# Patient Record
Sex: Female | Born: 1955 | Race: White | Hispanic: No | Marital: Married | Smoking: Former smoker
Health system: Southern US, Academic
[De-identification: ages and names within clinical notes are randomized; demographics above are authoritative.]

## PROBLEM LIST (undated history)

## (undated) DIAGNOSIS — M545 Low back pain, unspecified: Secondary | ICD-10-CM

## (undated) DIAGNOSIS — I10 Essential (primary) hypertension: Secondary | ICD-10-CM

## (undated) DIAGNOSIS — E559 Vitamin D deficiency, unspecified: Secondary | ICD-10-CM

## (undated) DIAGNOSIS — J449 Chronic obstructive pulmonary disease, unspecified: Secondary | ICD-10-CM

## (undated) HISTORY — DX: Chronic obstructive pulmonary disease, unspecified: J44.9

## (undated) HISTORY — PX: COLONOSCOPY: SHX174

## (undated) HISTORY — PX: HX LIPOMA RESECTION: SHX23

## (undated) HISTORY — DX: Low back pain, unspecified: M54.50

## (undated) HISTORY — DX: Vitamin D deficiency, unspecified: E55.9

## (undated) HISTORY — DX: Essential (primary) hypertension: I10

---

## 1993-05-27 ENCOUNTER — Other Ambulatory Visit (HOSPITAL_COMMUNITY): Payer: Self-pay | Admitting: Family Medicine

## 2021-05-30 ENCOUNTER — Other Ambulatory Visit: Payer: Self-pay

## 2021-05-30 ENCOUNTER — Ambulatory Visit (INDEPENDENT_AMBULATORY_CARE_PROVIDER_SITE_OTHER): Payer: BC Managed Care – PPO | Admitting: OTOLARYNGOLOGY

## 2021-05-30 ENCOUNTER — Encounter (INDEPENDENT_AMBULATORY_CARE_PROVIDER_SITE_OTHER): Payer: Self-pay | Admitting: OTOLARYNGOLOGY

## 2021-05-30 VITALS — Ht 68.0 in | Wt 170.0 lb

## 2021-05-30 DIAGNOSIS — H9201 Otalgia, right ear: Secondary | ICD-10-CM

## 2021-05-30 DIAGNOSIS — H6123 Impacted cerumen, bilateral: Secondary | ICD-10-CM

## 2021-05-30 DIAGNOSIS — J329 Chronic sinusitis, unspecified: Secondary | ICD-10-CM

## 2021-05-30 DIAGNOSIS — J309 Allergic rhinitis, unspecified: Secondary | ICD-10-CM

## 2021-05-30 MED ORDER — TRIAMCINOLONE ACETONIDE 55 MCG NASAL SPRAY AEROSOL
2.0000 | INHALATION_SPRAY | Freq: Every day | NASAL | 3 refills | Status: DC
Start: 2021-05-30 — End: 2022-12-28

## 2021-05-30 NOTE — Procedures (Signed)
[facility], [facility]  [address]  [place] [address]    Procedure Note    Name: Mckenzie Stewart MRN:  [id number]   Date: 05/30/2021 Age: 66 y.o.       [phone] - REMOVAL IMPACTED CERUMEN W/ INSTRUMENT, UNILATERAL (AMB ONLY-PD)  Performed by: Conchita Paris, DO  Authorized by: Conchita Paris, DO     Time Out:     Immediately before the procedure, a time out was called:  Yes    Patient verified:  Yes    Procedure Verified:  Yes    Site Verified:  Yes  Documentation:      Indications for procedure: Obstructive nasal breathing    Anesthesia: Oxymetazoline nasal spray    Description: Nasal endoscopy with rigid scope was performed with examination of the  septum, inferior, middle, and superior meatus, turbinates, sphenoethmoidal recess, and nasopharynx.     There were no polyps, pus, or granulation tissue noted.  ET orifices and nasopharynx were normal.     Findings: Septal deviation, AR, no mucopus or polyps    The patient tolerated the procedure well.          41660 - NASAL ENDOSCOPY DIAGNOSTIC UNILATERAL OR BILATERAL (AMB ONLY)  Performed by: Conchita Paris, DO  Authorized by: Conchita Paris, DO     Time Out:     Immediately before the procedure, a time out was called:  Yes    Patient verified:  Yes    Procedure Verified:  Yes    Site Verified:  Yes  Documentation:      Procedure: Cerumen cleaning  Pre-op Dx: Cerumen impaction      Bilateral EAC(s) examined under binocular microscopy.  Cerumen and/or debris was cleaned from the canal(s) using curettes, suction, and alligator forceps.          Conchita Paris, DO

## 2021-05-30 NOTE — H&P (Signed)
[facility], [facility]  [address]  [place] [address]  Phone: [phone]  Fax: [phone]      Encounter Date: 05/30/2021    Patient ID: Mckenzie Stewart  MRN: [id number]    DOB: 1955-12-15  Age: 66 y.o. female        Referring Provider:    Lynann Beaver, MD  [address]  [place],  [place] [address]    Reason for Visit:   Chief Complaint   Patient presents with   . Sinus Problem     Complains of allergies and nasal congestion. States took Singulair in the past and had vertigo after that.        History of Present Illness:  Mckenzie Stewart is a 66 y.o. female who is referred for eval of sinuses. Pt c/o frequent sinus infections, Q4-5 months last year. She usually has sinus pain in her cheeks and increased nasal drainage with infections. She has been taking sudafed for years but stopped recently. She uses saline only. Flonase caused nosebleeds.    She also c/o right earache intermittently, along with an occasional whoosh sound. Recalls an episode of vertigo lasting 2 weeks after taking Singulair in May 2022, and no recurrence.     Patient History:  There is no problem list on file for this patient.    Current Outpatient Medications   Medication Sig   . ascorbic acid (VITAMIN C) 1,000 mg Oral Tablet Take 1 Tablet (1,000 mg total) by mouth Once a day   . BREZTRI AEROSPHERE 160-9-4.8 mcg/actuation Inhalation HFA Aerosol Inhaler    . calcium citrate-vitamin D3 (CITRACAL) 200 mg-6.25 mcg (250 unit) Oral Tablet Take by mouth Once a day   . chlorpheniramine maleate (CHLORPHEN SR ORAL) Take by mouth   . cholecalciferol, vitamin D3, 250 mcg (10,000 unit) Oral Capsule Take 1 Capsule (10,000 Units total) by mouth Once a day   . guaiFENesin 100 mg/5 mL Oral Liquid Take by mouth Every 4 hours as needed   . hydrOXYzine pamoate (VISTARIL) 100 mg Oral Capsule Take 1 Capsule (100 mg total) by mouth Three times a day as needed for Itching   . pediatric multivitamins Oral Tablet, Chewable Chew 1 Tablet Once a  day   . Triamcinolone Acetonide (NASACORT) 55 mcg Nasal Aerosol, Spray Administer 2 Sprays into affected nostril(s) Once a day   . turmeric/turmeric ext/pepr ext (TURMERIC-TURMERIC EXT-PEPPER) 500-3 mg Oral Capsule Take by mouth     No Known Allergies  Past Medical History:   Diagnosis Date   . COPD (chronic obstructive pulmonary disease) (CMS HCC)       History reviewed. No pertinent surgical history.   Family Medical History:    None         Social History     Tobacco Use   . Smoking status: Never   . Smokeless tobacco: Never       Review of Systems:  Review of Systems   Constitutional: Negative.    HENT: Positive for ear pain, sinus pressure, sinus pain, tinnitus and voice change.        Physical Exam:  Ht 1.727 m (5\' 8" )   Wt 77.1 kg (170 lb)   BMI 25.85 kg/m       Physical Exam  Constitutional:       Appearance: Normal appearance. She is well-developed, well-groomed and normal weight.   HENT:      Head: Normocephalic and atraumatic.      Right Ear: Hearing, tympanic  membrane, ear canal and external ear normal. There is impacted cerumen.      Left Ear: Hearing, tympanic membrane, ear canal and external ear normal. There is impacted cerumen.      Nose: Septal deviation and mucosal edema present.      Right Turbinates: Enlarged.      Left Turbinates: Enlarged.      Mouth/Throat:      Lips: Pink.      Mouth: Mucous membranes are moist.      Pharynx: Oropharynx is clear. Uvula midline.   Eyes:      Extraocular Movements: Extraocular movements intact.   Neck:      Trachea: Phonation normal.   Pulmonary:      Effort: Pulmonary effort is normal.   Musculoskeletal:      Cervical back: Normal range of motion and neck supple.   Lymphadenopathy:      Cervical: No cervical adenopathy.   Skin:     General: Skin is warm.   Neurological:      Mental Status: She is alert and oriented to person, place, and time.      Cranial Nerves: Cranial nerves 2-12 are intact. No facial asymmetry.   Psychiatric:         Attention and  Perception: Attention normal.         Mood and Affect: Mood normal.         Speech: Speech normal.         Behavior: Behavior normal. Behavior is cooperative.         Assessment:  ENCOUNTER DIAGNOSES     ICD-10-CM   1. Otalgia of right ear  H92.01   2. Chronic sinusitis  J32.9   3. Allergic rhinitis  J30.9   4. Bilateral impacted cerumen  H61.23       Plan:  Medical records reviewed on 05/30/2021.  Will order audiogram. Will get Sinus CT. Bilat cerumen removed. Rx Nasacort  Orders Placed This Encounter   . 06301 - NASAL ENDOSCOPY DIAGNOSTIC UNILATERAL OR BILATERAL (AMB ONLY)   . 60109 - REMOVAL IMPACTED CERUMEN W/ INSTRUMENT, UNILATERAL (AMB ONLY-PD)   . CT SINUSES WO IV CONTRAST   . AMB PRN REFERRAL EXTERNAL AUDIOLOGIST   . Triamcinolone Acetonide (NASACORT) 55 mcg Nasal Aerosol, Spray     Return for after imaging.Marcelline Deist, PA-C  05/30/2021, 10:26  The advanced practice clinician's documentation was reviewed/amended in its entirety with the assessment and plan portion completely performed independently by me during this separate encounter.

## 2021-07-05 ENCOUNTER — Inpatient Hospital Stay
Admission: RE | Admit: 2021-07-05 | Discharge: 2021-07-05 | Disposition: A | Discharge status: Home / Self Care | Payer: BC Managed Care – PPO | Source: Ambulatory Visit | Attending: OTOLARYNGOLOGY | Admitting: OTOLARYNGOLOGY

## 2021-07-05 ENCOUNTER — Other Ambulatory Visit: Payer: Self-pay

## 2021-07-05 DIAGNOSIS — J329 Chronic sinusitis, unspecified: Secondary | ICD-10-CM | POA: Insufficient documentation

## 2021-07-12 ENCOUNTER — Other Ambulatory Visit (HOSPITAL_COMMUNITY): Payer: Self-pay | Admitting: NURSE PRACTITIONER

## 2021-07-12 DIAGNOSIS — Z1239 Encounter for other screening for malignant neoplasm of breast: Secondary | ICD-10-CM

## 2021-07-14 ENCOUNTER — Encounter (INDEPENDENT_AMBULATORY_CARE_PROVIDER_SITE_OTHER): Payer: Self-pay | Admitting: OTOLARYNGOLOGY

## 2021-07-14 ENCOUNTER — Other Ambulatory Visit: Payer: Self-pay

## 2021-07-14 ENCOUNTER — Ambulatory Visit (INDEPENDENT_AMBULATORY_CARE_PROVIDER_SITE_OTHER): Payer: BC Managed Care – PPO | Admitting: OTOLARYNGOLOGY

## 2021-07-14 VITALS — Ht 68.0 in | Wt 170.0 lb

## 2021-07-14 DIAGNOSIS — J309 Allergic rhinitis, unspecified: Secondary | ICD-10-CM

## 2021-07-14 DIAGNOSIS — J329 Chronic sinusitis, unspecified: Secondary | ICD-10-CM

## 2021-07-14 MED ORDER — SULFAMETHOXAZOLE 800 MG-TRIMETHOPRIM 160 MG TABLET
1.0000 | ORAL_TABLET | Freq: Two times a day (BID) | ORAL | 0 refills | Status: DC
Start: 2021-07-14 — End: 2022-12-28

## 2021-07-14 NOTE — H&P (Signed)
[facility]  [facility]    Progress Note    Name: Mckenzie Stewart MRN:  [id number]   Date: 07/14/2021 Age: 66 y.o.          Follow Up      Subjective:   Chief Complaint:   Follow-up After Testing (Rc after ct)       History of Present Illness:  Mckenzie Stewart is Stewart 66 y.o. old female who presents to the clinic for follow-up after CT. Results are below.  Patient states nasacort made her anxious.  Patient states that she is still having nasal congestion and drainage. Last abx was in Dec.       PACS Images    Show images for CT SINUSES WO IV CONTRAST    [facility], [facility]   [address]  [place] [address]   Phone: [phone] Fax: [phone]    PATIENT NAME: Mckenzie Stewart, Mckenzie Stewart: [id number]   BIRTH DATE: 12/26/1955 ORDER: [id number]   SEX: F ORDER FROM: [facility]   REQUESTING PHYS: Conchita ParisWeitzel, Meerab Maselli SERVICE DATE: 07/05/2021 10:42 AM    ATTENDING Vaughan BrownerPHYS: Conchita ParisWeitzel, Serine Kea REPORT DATE: 07/05/2021 10:56 AM   REASON: Chronic sinusitis     Final  CT SINUSES WO IV CONTRAST              Study Result    Narrative & Impression   Mckenzie Stewart    PROCEDURE DESCRIPTION: CT OF THE SINUSES WITHOUT INTRAVENOUS CONTRAST    CLINICAL HISTORY: Chronic sinusitis       COMPARISON: None        TECHNIQUE: Stewart CT of the paranasal sinuses is obtained utilizing contiguous axial imaging. Stewart intravenous contrast material was injected.      FINDINGS: Mild-to-moderate mucoperiosteal thickening is identified in the right maxillary sinus. Stewart air-fluid levels are noted.  Partial opacification of the right ostiomeatal unit is noted.Minimal deviation of the nasal septum is noted. Concha bullosa is noted on the left. Stewart focal soft tissue swelling is seen.  The mastoid air cells have Stewart normal appearance. The intracranial contents cannot be adequately evaluated secondary to technical factors.      IMPRESSION:  Mild to moderate mucoperiosteal thickening is seen in the right maxillary  sinus.      Radiologist location ID: [id number]         Linked Documents    View Report                   This study was interpreted by: Neomia DearGroten, David L, MD   This report has been reviewed and released by: Signed by: Neomia Dearavid L Groten, MD on 07/05/2021 10:56 AM           Linked Documents    View Report     PACS Images    Show images for CT SINUSES WO IV CONTRAST    Breast CA Risk Scores    Stewart risk assessment data    Patient Release Status:     This result is not viewable because Stewart one can access it in [facility].           In Basket Reviewed    Conchita ParisWeitzel, Judson Tsan, DO on 07/05/2021 11:07     Discharge Instructions  Mckenzie Stewart, Mckenzie Stewart (MRN [id number])  None     Additional Order Information    Open Additional Information     IR Procedure Timeline (If Applicable)    IR Procedure Timeline  Link to Procedure Log    Procedure Log        Decision Support      Appropriateness Score Ordering Provider     Indeterminate        Session ID Source CDSM Identifier Adherence   [id number] Web Service [facility] [id number]) Stewart Criteria Available (MG)     Date Time Consulted Exception Comment   05/30/21 10:48:18       Hard Copy Result Report    Open Hard Copy Result Report (Order #[id number] - CT SINUSES WO IV CONTRAST)     Exam Details    Technologist Performed   Cordie Grice, RT (R)(CT) 07/05/2021 10:42 AM       Radiation Exposure    Type Dose   CT 66 DLP (mGycm)     End Exam Questions    History:  Dx: Chronic sinusitis   Was patient education performed?  Yes   Patient was shielded? N/Stewart   Did you complete the lift assist section for the patient? NA   Personal Protective Equipment was worn? Stewart   PPE Used:  -   Was patient held during exposure? Stewart   Patient was held by: -     CT SINUSES WO IV CONTRAST  Order: [id number]   Status: Final result       Visible to patient: Stewart (inaccessible in [facility])       Dx: Chronic sinusitis       0 Result Notes      Details    Reading Physician Reading Date Result Priority    Neomia Dear, MD  [phone] 07/05/2021 Routine     Narrative & Impression  Mckenzie Stewart    PROCEDURE DESCRIPTION: CT OF THE SINUSES WITHOUT INTRAVENOUS CONTRAST    CLINICAL HISTORY: Chronic sinusitis       COMPARISON: None        TECHNIQUE: Stewart CT of the paranasal sinuses is obtained utilizing contiguous axial imaging. Stewart intravenous contrast material was injected.      FINDINGS: Mild-to-moderate mucoperiosteal thickening is identified in the right maxillary sinus. Stewart air-fluid levels are noted.  Partial opacification of the right ostiomeatal unit is noted.Minimal deviation of the nasal septum is noted. Concha bullosa is noted on the left. Stewart focal soft tissue swelling is seen.  The mastoid air cells have Stewart normal appearance. The intracranial contents cannot be adequately evaluated secondary to technical factors.      IMPRESSION:  Mild to moderate mucoperiosteal thickening is seen in the right maxillary sinus.               Review of Systems     Physical Exam:     Vitals:    07/14/21 1058   Weight: 77.1 kg (170 lb)   Height: 1.727 m (5\' 8" )   BMI: 25.9      ENT Physical Exam  Constitutional  Appearance: patient appears well-developed, well-nourished and well-groomed,  Communication/Voice: communication appropriate for developmental age; vocal quality normal;  Head and Face  Appearance: head appears normal, face appears normal and face appears atraumatic;  Palpation: facial palpation normal;  Salivary: glands normal;  Ear  Hearing: intact;  Auricles: right auricle normal; left auricle normal;  External Mastoids: right external mastoid normal; left external mastoid normal;  Ear Canals: right ear canal normal; left ear canal normal;  Tympanic Membranes: right tympanic membrane normal; left tympanic membrane normal;  Nose  External Nose: nares patent bilaterally; external nose normal;  Internal  Nose: bilateral intranasal mucosa edematous; nasal septal deviation present; bilateral inferior turbinates  erythematous; with hypertrophy;  Oral Cavity/Oropharynx  Lips: normal;  Teeth: normal;  Gums: gingiva normal;  Tongue: normal;  Oral mucosa: normal;  Hard palate: normal;  Neck  Neck: neck normal; neck palpation normal;  Thyroid: thyroid normal;  Respiratory  Inspection: breathing unlabored; normal breathing rate;  Lymphatic  Palpation: lymph nodes normal;  Neurovestibular  Mental Status: alert and oriented;  Psychiatric: mood normal; affect is appropriate;  Cranial Nerves: cranial nerves intact;       Assessment and Plan:       ICD-10-CM    1. Chronic sinusitis, unspecified location  J32.9       2. Allergic rhinitis, unspecified seasonality, unspecified trigger  J30.9         Orders Placed This Encounter   . trimethoprim-sulfamethoxazole (BACTRIM DS) 160-800mg  per tablet     CT reviewed and will give Stewart course of bactrim  Will check SET    Follow up:  Return for Follow up after testing.Conchita Paris, DO

## 2021-07-19 ENCOUNTER — Telehealth (INDEPENDENT_AMBULATORY_CARE_PROVIDER_SITE_OTHER): Payer: Self-pay | Admitting: OTOLARYNGOLOGY

## 2021-07-19 MED ORDER — AMOXICILLIN 500 MG CAPSULE
500.0000 mg | ORAL_CAPSULE | Freq: Two times a day (BID) | ORAL | 0 refills | Status: DC
Start: 2021-07-19 — End: 2021-08-11

## 2021-07-19 NOTE — Telephone Encounter (Signed)
See below. Please advise.  

## 2021-07-19 NOTE — Telephone Encounter (Signed)
Dr. Hulan Saas gave patient Bactrim says she cannot take this is asking for Amoxicillin without augmentin. [place]

## 2021-07-25 ENCOUNTER — Other Ambulatory Visit: Payer: Self-pay

## 2021-07-25 ENCOUNTER — Encounter (INDEPENDENT_AMBULATORY_CARE_PROVIDER_SITE_OTHER): Payer: Self-pay | Admitting: OTOLARYNGOLOGY

## 2021-07-25 ENCOUNTER — Ambulatory Visit (INDEPENDENT_AMBULATORY_CARE_PROVIDER_SITE_OTHER): Payer: BC Managed Care – PPO | Admitting: OTOLARYNGOLOGY

## 2021-07-25 VITALS — Ht 68.0 in | Wt 170.0 lb

## 2021-07-25 DIAGNOSIS — H9201 Otalgia, right ear: Secondary | ICD-10-CM

## 2021-07-25 DIAGNOSIS — J329 Chronic sinusitis, unspecified: Secondary | ICD-10-CM

## 2021-07-25 DIAGNOSIS — H903 Sensorineural hearing loss, bilateral: Secondary | ICD-10-CM

## 2021-07-25 DIAGNOSIS — J309 Allergic rhinitis, unspecified: Secondary | ICD-10-CM

## 2021-07-25 MED ORDER — AZELASTINE 137 MCG (0.1 %) NASAL SPRAY
1.0000 | Freq: Two times a day (BID) | NASAL | 3 refills | Status: DC
Start: 2021-07-25 — End: 2022-12-28

## 2021-07-25 NOTE — H&P (Signed)
[facility], [facility]  [address]  [place] [address]  Phone: [phone]  Fax: [phone]      Encounter Date: 07/25/2021    Patient ID: MATTIA OSTERMAN  MRN: [id number]    DOB: Jul 31, 1955  Age: 66 y.o. female     Progress Note       Referring Provider:  No ref. provider found    Reason for Visit:   Chief Complaint   Patient presents with   . Follow-up After Testing     Rc after audio        History of Present Illness:  Mckenzie Stewart is a 66 y.o. female who is FU on CRS. She took 4.5 days of Bactrim but didn't tolerate it well (felt arm weakness). She started Amox last week for R>L facial pressure. Asso with PND. Sx's may be slightly better. Denies fever. SET scheduled for June 5.     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    Current Outpatient Medications   Medication Sig   . amoxicillin (AMOXIL) 500 mg Oral Capsule Take 1 Capsule (500 mg total) by mouth Twice daily   . ascorbic acid (VITAMIN C) 1,000 mg Oral Tablet Take 1 Tablet (1,000 mg total) by mouth Once a day   . azelastine (ASTELIN) 137 mcg (0.1 %) Nasal Aerosol, Spray Administer 1 Spray into each nostril Twice daily Use in each nostril as directed   . BREZTRI AEROSPHERE 160-9-4.8 mcg/actuation Inhalation HFA Aerosol Inhaler    . calcium citrate-vitamin D3 (CITRACAL) 200 mg-6.25 mcg (250 unit) Oral Tablet Take by mouth Once a day   . chlorpheniramine maleate (CHLORPHEN SR ORAL) Take by mouth   . cholecalciferol, vitamin D3, 250 mcg (10,000 unit) Oral Capsule Take 1 Capsule (10,000 Units total) by mouth Once a day   . guaiFENesin 100 mg/5 mL Oral Liquid Take by mouth Every 4 hours as needed   . hydrOXYzine pamoate (VISTARIL) 100 mg Oral Capsule Take 1 Capsule (100 mg total) by mouth Three times a day as needed for Itching   . pediatric multivitamins Oral Tablet, Chewable Chew 1 Tablet Once a day   . Triamcinolone Acetonide (NASACORT)  55 mcg Nasal Aerosol, Spray Administer 2 Sprays into affected nostril(s) Once a day   . trimethoprim-sulfamethoxazole (BACTRIM DS) 160-800mg  per tablet Take 1 Tablet (160 mg total) by mouth Twice daily   . turmeric/turmeric ext/pepr ext (TURMERIC-TURMERIC EXT-PEPPER) 500-3 mg Oral Capsule Take by mouth      No Known Allergies  Past Medical History:   Diagnosis Date   . COPD (chronic obstructive pulmonary disease) (CMS HCC)      History reviewed. No pertinent surgical history.  Family Medical History:    None         Social History     Tobacco Use   . Smoking status: Never   . Smokeless tobacco: Never       Review of Systems:  Review of Systems    Physical Exam:  Ht 1.727 m (5\' 8" )   Wt 77.1 kg (170 lb)   BMI 25.85 kg/m       Physical Exam  Constitutional:       Appearance: Normal appearance. She is well-developed, well-groomed and normal weight.   HENT:      Head: Normocephalic and atraumatic.  Right Ear: Hearing, tympanic membrane, ear canal and external ear normal.      Left Ear: Hearing, tympanic membrane, ear canal and external ear normal.      Nose: Septal deviation and mucosal edema present.      Right Turbinates: Enlarged.      Left Turbinates: Enlarged.      Mouth/Throat:      Lips: Pink.      Mouth: Mucous membranes are moist.      Pharynx: Oropharynx is clear. Uvula midline.   Eyes:      Extraocular Movements: Extraocular movements intact.   Neck:      Trachea: Phonation normal.   Pulmonary:      Effort: Pulmonary effort is normal.   Musculoskeletal:      Cervical back: Normal range of motion and neck supple.   Lymphadenopathy:      Cervical: No cervical adenopathy.   Skin:     General: Skin is warm.   Neurological:      Mental Status: She is alert and oriented to person, place, and time.      Cranial Nerves: Cranial nerves 2-12 are intact. No facial asymmetry.   Psychiatric:         Attention and Perception: Attention normal.         Mood and Affect: Mood normal.         Speech: Speech normal.          Behavior: Behavior normal. Behavior is cooperative.         Assessment:  ENCOUNTER DIAGNOSES     ICD-10-CM   1. Chronic sinusitis, unspecified location  J32.9   2. Allergic rhinitis, unspecified seasonality, unspecified trigger  J30.9   3. Otalgia of right ear  H92.01   4. 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  Reviewed audiogram. Doreatha Martin course of Amox. Rx Azelastine.   Orders Placed This Encounter   . AMB PRN REFERRAL EXTERNAL AUDIOLOGIST   . azelastine (ASTELIN) 137 mcg (0.1 %) Nasal Aerosol, Spray     Return for follow up after ABR.    Marcelline Deist, PA-C  07/25/2021, 15:53   The advanced practice clinician's documentation was reviewed/amended in its entirety with the assessment and plan portion completely performed independently by me during this separate encounter.

## 2021-07-29 ENCOUNTER — Telehealth (INDEPENDENT_AMBULATORY_CARE_PROVIDER_SITE_OTHER): Payer: Self-pay | Admitting: OTOLARYNGOLOGY

## 2021-07-29 NOTE — Telephone Encounter (Signed)
[facility] called they need the referral for this patient to be seen there

## 2021-07-29 NOTE — Telephone Encounter (Signed)
faxed

## 2021-08-11 ENCOUNTER — Other Ambulatory Visit: Payer: Self-pay

## 2021-08-11 ENCOUNTER — Ambulatory Visit (INDEPENDENT_AMBULATORY_CARE_PROVIDER_SITE_OTHER): Payer: BC Managed Care – PPO | Admitting: OTOLARYNGOLOGY

## 2021-08-11 ENCOUNTER — Encounter (INDEPENDENT_AMBULATORY_CARE_PROVIDER_SITE_OTHER): Payer: Self-pay | Admitting: OTOLARYNGOLOGY

## 2021-08-11 VITALS — Ht 68.0 in | Wt 170.0 lb

## 2021-08-11 DIAGNOSIS — H903 Sensorineural hearing loss, bilateral: Secondary | ICD-10-CM

## 2021-08-11 DIAGNOSIS — J309 Allergic rhinitis, unspecified: Secondary | ICD-10-CM

## 2021-08-11 DIAGNOSIS — J329 Chronic sinusitis, unspecified: Secondary | ICD-10-CM

## 2021-08-11 NOTE — H&P (Signed)
[facility] [place]    Progress Note    Name: ABIGAELLE STRIDE MRN:  [id number]   Date: 08/11/2021 Age: 66 y.o.          Follow Up      Subjective:   Chief Complaint:   Follow-up After Testing (Rc after abr)       History of Present Illness:  WENONA MAGNONE is a 65 y.o. old female who presents to the clinic for follow-up after ABR which is normal.  Patient denies any new complaints.  Patient states that asetlin made her very jitter and she could not take.      Review of Systems     Physical Exam:     Vitals:    08/11/21 1116   Weight: 77.1 kg (170 lb)   Height: 1.727 m (5\' 8" )   BMI: 25.9      ENT Physical Exam  Constitutional  Appearance: patient appears well-developed, well-nourished and well-groomed,  Communication/Voice: communication appropriate for developmental age; vocal quality normal;  Head and Face  Appearance: head appears normal, face appears normal and face appears atraumatic;  Palpation: facial palpation normal;  Salivary: glands normal;  Ear  Hearing: intact;  Auricles: right auricle normal; left auricle normal;  External Mastoids: right external mastoid normal; left external mastoid normal;  Ear Canals: right ear canal normal; left ear canal normal;  Tympanic Membranes: right tympanic membrane normal; left tympanic membrane normal;  Nose  External Nose: nares patent bilaterally; external nose normal;  Internal Nose: bilateral intranasal mucosa edematous; nasal septal deviation present; bilateral inferior turbinates erythematous; with hypertrophy;  Oral Cavity/Oropharynx  Lips: normal;  Teeth: normal;  Gums: gingiva normal;  Tongue: normal;  Oral mucosa: normal;  Hard palate: normal;  Neck  Neck: neck normal; neck palpation normal;  Thyroid: thyroid normal;  Respiratory  Inspection: breathing unlabored; normal breathing rate;  Lymphatic  Palpation: lymph nodes normal;  Neurovestibular  Mental Status: alert and oriented;  Psychiatric: mood normal; affect is appropriate;  Cranial Nerves:  cranial nerves intact;       Assessment and Plan:       ICD-10-CM    1. Chronic sinusitis, unspecified location  J32.9       2. Allergic rhinitis, unspecified seasonality, unspecified trigger  J30.9       3. 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    Dia Sitter, DO

## 2021-08-15 ENCOUNTER — Ambulatory Visit (INDEPENDENT_AMBULATORY_CARE_PROVIDER_SITE_OTHER): Payer: BC Managed Care – PPO

## 2021-08-15 ENCOUNTER — Ambulatory Visit (INDEPENDENT_AMBULATORY_CARE_PROVIDER_SITE_OTHER): Payer: BC Managed Care – PPO | Admitting: OTOLARYNGOLOGY

## 2021-08-15 ENCOUNTER — Other Ambulatory Visit: Payer: Self-pay

## 2021-08-15 ENCOUNTER — Encounter (INDEPENDENT_AMBULATORY_CARE_PROVIDER_SITE_OTHER): Payer: Self-pay | Admitting: OTOLARYNGOLOGY

## 2021-08-15 VITALS — Ht 68.0 in | Wt 170.0 lb

## 2021-08-15 DIAGNOSIS — H903 Sensorineural hearing loss, bilateral: Secondary | ICD-10-CM

## 2021-08-15 DIAGNOSIS — J3089 Other allergic rhinitis: Secondary | ICD-10-CM

## 2021-08-15 DIAGNOSIS — Z91038 Other insect allergy status: Secondary | ICD-10-CM

## 2021-08-15 DIAGNOSIS — J3081 Allergic rhinitis due to animal (cat) (dog) hair and dander: Secondary | ICD-10-CM

## 2021-08-15 DIAGNOSIS — J329 Chronic sinusitis, unspecified: Secondary | ICD-10-CM

## 2021-08-15 DIAGNOSIS — J301 Allergic rhinitis due to pollen: Secondary | ICD-10-CM

## 2021-08-15 NOTE — Nursing Note (Signed)
08/15/21 1500   Allergy Testing   Is the patient on a beta blocker? No   Does the patient have asthma? No   Site    Testing site 1 Right Side   French Daegen Berrocal Territories Grass   French Julicia Krieger Territories Grass Dilution 4 5   French Teaghan Formica Territories Grass Dilution 3 6   French Bailyn Spackman Territories Grass Dilution 2 8   TOTAL French Leeann Bady Territories GRASS DILUTION @ 3   French Oliviagrace Crisanti Territories Grass Total Single/Multiple Stick: 3   Timothy Grass   Timothy Grass Dilution 4 6   Timothy Grass Dilution 2 6   Timothy Grass Total Single/Multiple Stick: 2   [place]    Bahia Dilution 4 5   Bahia Dilution 3 5   Bahia Dilution 2 8   TOTAL BAHIA DILUTION @ 3   Bahia Total Single/Multiple Stick: 3   Ragweed   Ragweed Dilution 4 5   Ragweed Dilution 3 7   Ragweed Dilution 2 8   TOTAL RAGWEED DILUTION @ 3   Ragweed Total Single/Multiple Stick: 3   Mugwort   Mugwort Dilution 5 5   Mugwort Dilution 4 8   TOTAL MUGWORT DILUTION @ 5   Mugwort Total Single/Multiple Stick: 2   Guernsey Thistle   Guernsey Thistle Dilution 5 5   Russian Thistle Dilution 4 7   TOTAL RUSSIAN THISTLE DILUTION @ 4   Russian Thistle Total Single/Multiple Stick: 2   Maple (Box Elder)   Maple Dilution 4 5   Maple Dilution 3 5   Maple Dilution 2 8   TOTAL MAPLE DILUTION @ 3   Maple Total Single/Multiple Stick: 3   Red Oak   [employer]  Dilution 4 5   Red Oak  Dilution 3 5   Red Oak  Dilution 2 7   TOTAL RED OAK DILUTION @ 2   Red Oak Total Single/Multiple Stick: 3   American Sycamore   American Sycamore Dilution 4 5   American Sycamore Dilution 3 5   American Sycamore Dilution 2 8   TOTAL AMERICAN SYCAMORE DILUTION   @ 3   American Sycamore Total Single/Multiple Stick: 3   Pine   Pine Dilution 4 5   Pine Dilution 2 6   Pine Total Single/Multiple Stick: 2   SITE   Testing site 2 Right Side   Cladosporium Sphaerospermum   Cladosporium Sphaerospermum Dilution 4 5   Cladosporium Sphaerospermum Dilution 2 6   Cladosporium Sphaerospermum Total Single/Multiple Stick: 2   Aspergillus   Aspergillus Dilution 4 5   Aspergillus Dilution 3 5   Aspergillus Dilution 2 8   TOTAL  ASPERGILLUS  DILUTION @ 3   Aspergillus Total Single/Multiple Stick: 3   Alternaria   Alternaria Dilution 4 5   Alternaria Dilution 3 5   Alternaria Dilution 2 8   TOTAL ALTERNARIA DILUTION @ 3   Alternaria Total Single/Multiple Stick: 3   Candida   Candida Dilution 4 5   Candida Dilution 2 6   Candida Total Single/Multiple Stick: 2   Epidermophyton   Epidermophyton Dilution 4 5   Epidermophyton Dilution 3 7   Epidermophyton Dilution 2 7   TOTAL EPIDERMOPHYTON DILUTION @ 3   Epidermophyton Total Single/Multiple Stick: 3   Sarocladium Strictum   Sarocladium Strictum Dilution 4 5   Sarocladium Strictum Dilution 3 5   Sarocladium Strictum Dilution 2 7   TOTAL SAROCLADIUM STRICTUM DILUTION @ 2   Sarocladium Strictum Total Single/Multiple Stick: 3   Bipolaris Sorokinina   Bipolaris Sorokiniana Dilution 4 6  Bipolaris Sorokiniana Dilution 2 5   Bipolaris Sorokiniana Total Single/Multiple Stick: 2   Penicillium   Penicillium Dilution 4 5   Penicillium Dilution 2 5   Penicillium Total Single/Multiple Stick: 2   Corn Smut   Corn Smut Dilution 4 5   Corn Smut Dilution 2 5   Corn Smut Total Single/Multiple Stick: 2   Aureobasidium Pullulans   Aureobasidium Pullulans Dilution 4 5   Aureobasidium Pullulans Dilution 3 5   Aureobasidium Pullulans Dilution 2 7   TOTAL AUREOBASIDIUM PULLULANS DILUTION @ 2   Aureobasidium Pullulans Total Single/Multiple Stick: 3   Gibberella Pulicaris   Gibberella Pulicaris Dilution 4 5   Gibberella Pulicaris Dilution 2 5   Gibberella Pulicaris Total Single/Multiple Stick: 2   Epicoccum   Epicoccum Dilution 4 5   Epicoccum Dilution 3 5   Epicoccum Dilution 2 8   TOTAL EPICOCCUM DILUTION @ 3   Epicoccum Total Single/Multiple Stick: 3   Mucor   Mucor Dilution 4 5   Mucor Dilution 2 6   Mucor Total Single/Multiple Stick: 2   Cockroach   Cockroach Dilution 4 5   Cockroach Dilution 3 5   Cockroach Dilution 2 8   TOTAL COCKROACH DILUTION @ 3   Cockroach Total Single/Multiple Stick: 3   Cat   Cat Dilution  4 5   Cat Dilution 3 5   Cat Dilution 2 8   TOTAL CAT DILUTION @ 3   Cat Total Single/Multiple Stick: 3   Dog   Dog Dilution 4 5   Dog Dilution 2 5   Dog Total Single/Multiple Stick: 2   Rabbit   Rabbit Total Single/Multiple Stick: 3   Dustmite Farinae   Dustmite Farinae Dilution 4 6   Dustmite Farinae Dilution 3 7   TOTAL DUSTMITE FARINAE DILUTION @ 3   Dustmite Farinae Total Single/Multiple Stick: 2   Dustmite Pteronyssinus   Dustmite Pteronyssinus Dilution 4 6   Dustmite Pteronyssinus Dilution 3 7   TOTAL DUSTMITE PTERONYSSINUS DILUTION @ 3   Dustmite Pteronyssinus Total Single/Multiple Stick: 2   Histamine    Histamine 7   Glycerin   Glycerin Class 2 5   Initials   Initials rs   Jasmine Awe, LPN

## 2021-08-15 NOTE — Progress Notes (Signed)
See SET results.

## 2021-08-15 NOTE — H&P (Signed)
ENT, [facility]  [address]  [place] [address]  Phone: [phone]  Fax: [phone]      Encounter Date: 08/15/2021    Patient ID: Mckenzie Stewart  MRN: [id number]    DOB: 06-May-1955  Age: 66 y.o. female     Progress Note       Referring Provider:  No ref. provider found    Reason for Visit:   Chief Complaint   Patient presents with   . Follow-up After Testing     Rc after set testing        History of Present Illness:  Mckenzie Stewart is a 66 y.o. female who is FU on AR. Her allergies are worse since stopping allergy meds for testing.     SET- +reactions to weeds, grass, molds, dust, dander...  Patient History:  There is no problem list on file for this patient.    Current Outpatient Medications   Medication Sig   . ascorbic acid (VITAMIN C) 1,000 mg Oral Tablet Take 1 Tablet (1,000 mg total) by mouth Once a day   . azelastine (ASTELIN) 137 mcg (0.1 %) Nasal Aerosol, Spray Administer 1 Spray into each nostril Twice daily Use in each nostril as directed   . BREZTRI AEROSPHERE 160-9-4.8 mcg/actuation Inhalation HFA Aerosol Inhaler    . calcium citrate-vitamin D3 (CITRACAL) 200 mg-6.25 mcg (250 unit) Oral Tablet Take by mouth Once a day   . chlorpheniramine maleate (CHLORPHEN SR ORAL) Take by mouth   . cholecalciferol, vitamin D3, 250 mcg (10,000 unit) Oral Capsule Take 1 Capsule (10,000 Units total) by mouth Once a day   . guaiFENesin 100 mg/5 mL Oral Liquid Take by mouth Every 4 hours as needed   . hydrOXYzine pamoate (VISTARIL) 100 mg Oral Capsule Take 1 Capsule (100 mg total) by mouth Three times a day as needed for Itching   . pediatric multivitamins Oral Tablet, Chewable Chew 1 Tablet Once a day   . Triamcinolone Acetonide (NASACORT) 55 mcg Nasal Aerosol, Spray Administer 2 Sprays into affected nostril(s) Once a day   . trimethoprim-sulfamethoxazole (BACTRIM DS) 160-800mg  per tablet Take 1 Tablet (160 mg total) by mouth Twice daily   . turmeric/turmeric ext/pepr ext (TURMERIC-TURMERIC  EXT-PEPPER) 500-3 mg Oral Capsule Take by mouth      No Known Allergies  Past Medical History:   Diagnosis Date   . COPD (chronic obstructive pulmonary disease) (CMS HCC)      History reviewed. No pertinent surgical history.  Family Medical History:    None         Social History     Tobacco Use   . Smoking status: Never   . Smokeless tobacco: Never       Review of Systems:  Review of Systems    Physical Exam:  Ht 1.727 m (5\' 8" )   Wt 77.1 kg (170 lb)   BMI 25.85 kg/m       Physical Exam  Constitutional:       Appearance: Normal appearance. She is well-developed, well-groomed and normal weight.   HENT:      Head: Normocephalic and atraumatic.      Right Ear: Hearing, tympanic membrane, ear canal and external ear normal.      Left Ear: Hearing, tympanic membrane, ear canal and external ear normal.      Nose: Septal deviation and mucosal edema present.      Right Turbinates: Enlarged.      Left Turbinates: Enlarged.  Mouth/Throat:      Lips: Pink.      Mouth: Mucous membranes are moist.      Pharynx: Oropharynx is clear. Uvula midline.   Eyes:      Extraocular Movements: Extraocular movements intact.   Neck:      Trachea: Phonation normal.   Pulmonary:      Effort: Pulmonary effort is normal.   Musculoskeletal:      Cervical back: Normal range of motion and neck supple.   Lymphadenopathy:      Cervical: No cervical adenopathy.   Skin:     General: Skin is warm.   Neurological:      Mental Status: She is alert and oriented to person, place, and time.      Cranial Nerves: Cranial nerves 2-12 are intact. No facial asymmetry.   Psychiatric:         Attention and Perception: Attention normal.         Mood and Affect: Mood normal.         Speech: Speech normal.         Behavior: Behavior normal. Behavior is cooperative.         Assessment:  ENCOUNTER DIAGNOSES     ICD-10-CM   1. Seasonal allergic rhinitis due to pollen  J30.1   2. Chronic sinusitis, unspecified location  J32.9   3. 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  Reviewed SET. Pt considering immunotherapy and wishes to start. Start astelin.     Return in about 3 months (around 11/15/2021).    Marcelline Deist, PA-C  08/15/2021, 16:21   The advanced practice clinician's documentation was reviewed/amended in its entirety with the assessment and plan portion completely performed independently by me during this separate encounter.

## 2021-08-17 ENCOUNTER — Other Ambulatory Visit (INDEPENDENT_AMBULATORY_CARE_PROVIDER_SITE_OTHER): Payer: BC Managed Care – PPO | Admitting: OTOLARYNGOLOGY

## 2021-08-17 DIAGNOSIS — J3089 Other allergic rhinitis: Secondary | ICD-10-CM

## 2021-08-17 DIAGNOSIS — J301 Allergic rhinitis due to pollen: Secondary | ICD-10-CM

## 2021-08-17 NOTE — Nursing Note (Signed)
08/17/21 1300   ENT Immunotherapy Vial Preparation Flowsheet   Allergy Test Date 08/15/21   Managing Physician Romney Compean   Initials rs   A   Preparation Date 08/17/21   Expiration Date 11/29/21   Immunotherapy Status   (New Start)   Diagnosis Allergic Rhinitis due to pollen (J30.1)   [place] Grass   Initial Endpoint - [place] Grass 3   Dilution of Antigen 1   Volume of Antigen 0.25ml   [place]   Initial Endpoint - Bahia 3   Dilution of Antigen 1   Volume of Antigen 0.86ml   Ragweed   Initial Endpoint - Ragweed 3   Dilution of Antigen 1   Volume of Antigen 0.22ml   Mugwort   Initial Endpoint - Mugwort 5   Dilution of Antigen 3   Volume of Antigen 0.46ml   Guernsey Thistle   Initial Endpoint - Guernsey Thistle 4   Dilution of Antigen  2   Volume of Antigen 0.59ml   Maple   Initial Endpoint - Maple 3   Dilution of Antigen 1   Volume of Antigen 0.74ml   Oak   Initial Endpoint - Oak 2   Dilution of Antigen Concentrate   Volume of Antigen 0.51ml   Sycamore   Initial Endpoint - Sycamore 3   Dilution of Antigen 1   Volume of Antigen 0.52ml   A   Concentrates A 1.6   10% Glycerine Diluent A 3.9   Total Volume A 5.5   B   Preparation Date 08/17/21   Expiration Date 11/19/21   Immunotherapy Status   (New Start)   Diagnosis Allergic Rhinitis due to cat (J30.81);Allergic Rhinitis due to dustmite (J30.89);Allergic Rhinitis due to mold (J30.89);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 (Z91.038)   Aspergillus   Initial Endpoint - Aspergillus 3   Dilution of Antigen 1   Volume of Antigen 0.55ml   Alternaria   Initial Endpoint - Alternaria 3   Dilution of Antigen 1   Volume of Antigen 0.76ml   Epidermophyton   Initial Endpoint - Epidermophyton 3   Dilution of Antigen 1   Volume of Antigen 0.53ml   Sarocladium Strictum   Initial Endpoint - Sarocladium Strictum 2   Dilution of Antigen Concentrate   Volume of Antigen 0.31ml   Aureobasidium Pullulans   Initial Endpoint - Aureobasidium Pullulans 2   Dilution of Antigen Concentrate   Volume of Antigen 0.9ml    Epicoccum   Initial Endpoint - Epicoccum 3   Dilution of Antigen 1   Volume of Antigen 0.65ml   Cockroach   Initial Endpoint - Cockroach 3   Dilution of Antigen 1   Volume of Antigen 0.60ml   Cat   Initial Endpoint - Cat 3   Dilution of Antigen 1   Volume of Antigen 0.38ml   D. Farinae Dust Mite   Initial Endpoint - D. Farinae Dust Mite 3   Dilution of Antigen Concentrate   Volume of Antigen 0.75ml   D. Pteronyssinus   Initial Endpoint - D. Pteronyssinus 3   Dilution of Antigen Concentrate   Volume of Antigen 0.65ml   B   Concentrates B 2   10% Glycerine Diluent B 3.5   Total Volume B 5.5   Jasmine Awe, LPN

## 2021-08-30 ENCOUNTER — Ambulatory Visit (INDEPENDENT_AMBULATORY_CARE_PROVIDER_SITE_OTHER): Payer: BC Managed Care – PPO

## 2021-08-30 ENCOUNTER — Other Ambulatory Visit: Payer: Self-pay

## 2021-08-30 ENCOUNTER — Other Ambulatory Visit (INDEPENDENT_AMBULATORY_CARE_PROVIDER_SITE_OTHER): Payer: Self-pay | Admitting: NURSE PRACTITIONER

## 2021-08-30 DIAGNOSIS — J309 Allergic rhinitis, unspecified: Secondary | ICD-10-CM

## 2021-08-30 MED ORDER — EPINEPHRINE 0.3 MG/0.3 ML INJECTION, AUTO-INJECTOR
0.3000 mg | AUTO-INJECTOR | Freq: Once | INTRAMUSCULAR | 0 refills | Status: DC | PRN
Start: 2021-08-30 — End: 2023-06-12

## 2021-08-30 NOTE — Nursing Note (Signed)
08/30/21 1400   Vial A   Set 0   Skin test/Wheal Size 0.56ml/ 52mm   Injection Laterality Right   Injection Site Upper Arm   Dose  0.05   Time Observed 30 Minutes   Injection Site Reaction Negative   Vial B   Set 0   Skin test/Wheal Size 0.8ml/ 6mm   Injection Laterality Left   Injection Site Upper Arm   Dose  0.05   Time Observed 30 Minutes   Injection Site Reaction Negative     Merlinda Frederick, LPN  I have reviewed the above allergy vial safety test, give injection.  Elnora Morrison, FNP-BC

## 2021-08-30 NOTE — Telephone Encounter (Signed)
Pt. Needs an RX for an epi pen. Please send RX to [facility].  Merlinda Frederick, LPN

## 2021-09-06 ENCOUNTER — Ambulatory Visit (INDEPENDENT_AMBULATORY_CARE_PROVIDER_SITE_OTHER): Payer: Self-pay

## 2021-09-08 ENCOUNTER — Ambulatory Visit (INDEPENDENT_AMBULATORY_CARE_PROVIDER_SITE_OTHER): Payer: BC Managed Care – PPO

## 2021-09-14 ENCOUNTER — Ambulatory Visit (INDEPENDENT_AMBULATORY_CARE_PROVIDER_SITE_OTHER): Payer: Self-pay

## 2021-09-15 ENCOUNTER — Ambulatory Visit (INDEPENDENT_AMBULATORY_CARE_PROVIDER_SITE_OTHER): Payer: BC Managed Care – PPO

## 2021-09-15 ENCOUNTER — Ambulatory Visit (HOSPITAL_COMMUNITY): Payer: Self-pay

## 2021-09-20 ENCOUNTER — Ambulatory Visit (INDEPENDENT_AMBULATORY_CARE_PROVIDER_SITE_OTHER): Payer: Self-pay

## 2021-09-26 ENCOUNTER — Inpatient Hospital Stay
Admission: RE | Admit: 2021-09-26 | Discharge: 2021-09-26 | Disposition: A | Discharge status: Home / Self Care | Payer: BC Managed Care – PPO | Source: Ambulatory Visit | Attending: NURSE PRACTITIONER | Admitting: NURSE PRACTITIONER

## 2021-09-26 ENCOUNTER — Other Ambulatory Visit: Payer: Self-pay

## 2021-09-26 ENCOUNTER — Encounter (HOSPITAL_COMMUNITY): Payer: Self-pay

## 2021-09-26 DIAGNOSIS — Z1239 Encounter for other screening for malignant neoplasm of breast: Secondary | ICD-10-CM

## 2021-09-26 DIAGNOSIS — Z1231 Encounter for screening mammogram for malignant neoplasm of breast: Secondary | ICD-10-CM | POA: Insufficient documentation

## 2021-09-27 ENCOUNTER — Ambulatory Visit (INDEPENDENT_AMBULATORY_CARE_PROVIDER_SITE_OTHER): Payer: Self-pay

## 2021-10-04 ENCOUNTER — Ambulatory Visit (INDEPENDENT_AMBULATORY_CARE_PROVIDER_SITE_OTHER): Payer: Self-pay

## 2021-10-11 ENCOUNTER — Ambulatory Visit (INDEPENDENT_AMBULATORY_CARE_PROVIDER_SITE_OTHER): Payer: Self-pay

## 2021-10-12 ENCOUNTER — Other Ambulatory Visit: Payer: Self-pay

## 2021-10-12 ENCOUNTER — Other Ambulatory Visit (HOSPITAL_COMMUNITY): Payer: Self-pay | Admitting: NURSE PRACTITIONER

## 2021-10-12 ENCOUNTER — Inpatient Hospital Stay
Admission: RE | Admit: 2021-10-12 | Discharge: 2021-10-12 | Disposition: A | Discharge status: Home / Self Care | Payer: BC Managed Care – PPO | Source: Ambulatory Visit | Attending: NURSE PRACTITIONER | Admitting: NURSE PRACTITIONER

## 2021-10-12 DIAGNOSIS — M2578 Osteophyte, vertebrae: Secondary | ICD-10-CM

## 2021-10-12 DIAGNOSIS — M545 Low back pain, unspecified: Secondary | ICD-10-CM

## 2021-10-18 ENCOUNTER — Other Ambulatory Visit (HOSPITAL_COMMUNITY): Payer: Self-pay | Admitting: NURSE PRACTITIONER

## 2021-10-18 DIAGNOSIS — M5416 Radiculopathy, lumbar region: Secondary | ICD-10-CM

## 2021-10-25 ENCOUNTER — Inpatient Hospital Stay (HOSPITAL_COMMUNITY): Admission: RE | Admit: 2021-10-25 | Payer: BC Managed Care – PPO | Source: Ambulatory Visit

## 2021-11-11 IMAGING — MR MRI LUMBAR SPINE WITHOUT CONTRAST
5 of 6 series · 32 of 48 positions shown · non-contrast
Comparison: None available.

﻿EXAM:  [id number]   MRI LUMBAR SPINE WITHOUT CONTRAST
INDICATION: Low back pain with sciatica in bilateral legs.
TECHNIQUE: Noncontrast multiplanar, multisequence MRI was performed.

[Series 5: T2 · sagittal · 4.0mm · 0.94mm/px · 7 of 13 slices shown (1 of 3)]
[im 1/13]
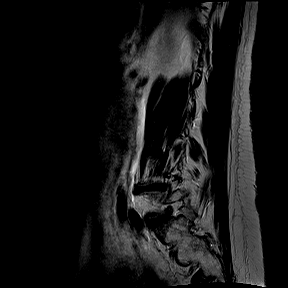
[im 3/13]
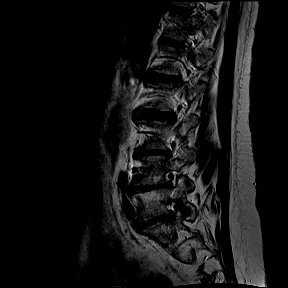
[im 5/13]
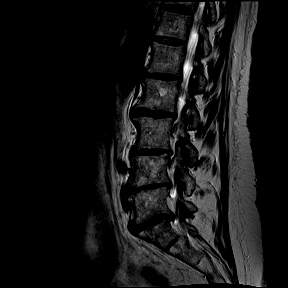
[im 7/13]
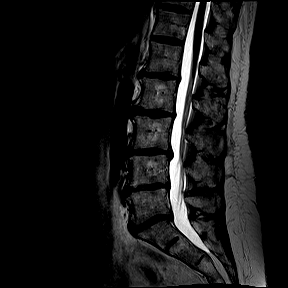
[im 9/13]
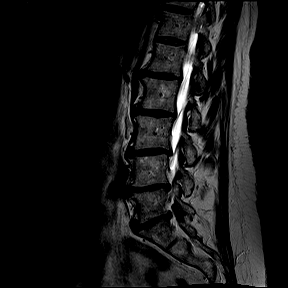
[im 11/13]
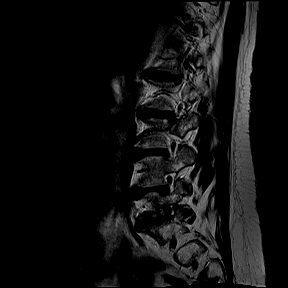
[im 13/13]
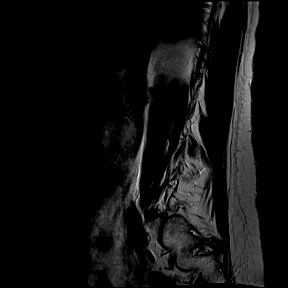

[Series 6: T1 · sagittal · 4.0mm · 0.94mm/px · 6 of 13 slices shown (1 of 2)]
[im 1/13]
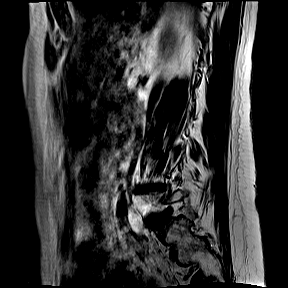
[im 3/13]
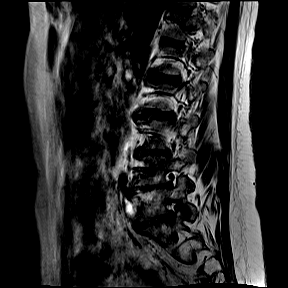
[im 5/13]
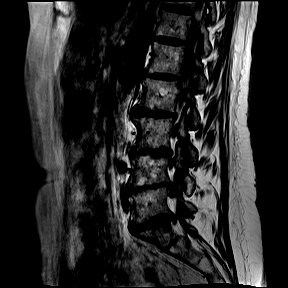
[im 8/13]
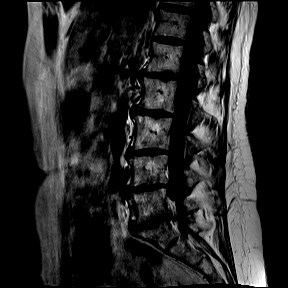
[im 10/13]
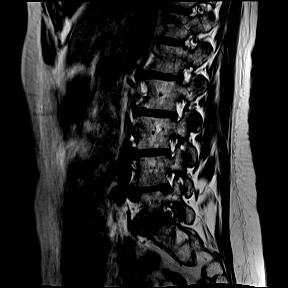
[im 13/13]
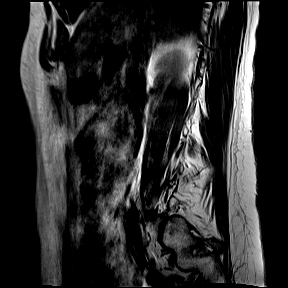

[Series 8: T2 · coronal · 5.0mm · 0.82mm/px · 9 of 18 slices shown (2 of 3)]
[im 1/18]
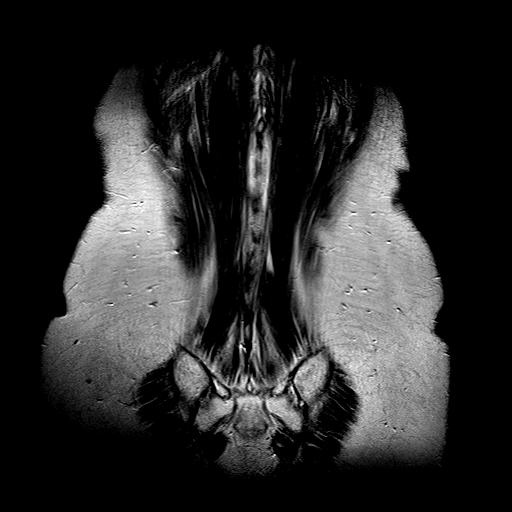
[im 3/18]
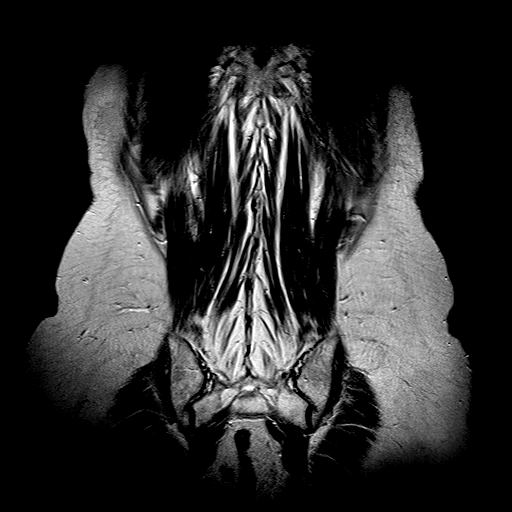
[im 5/18]
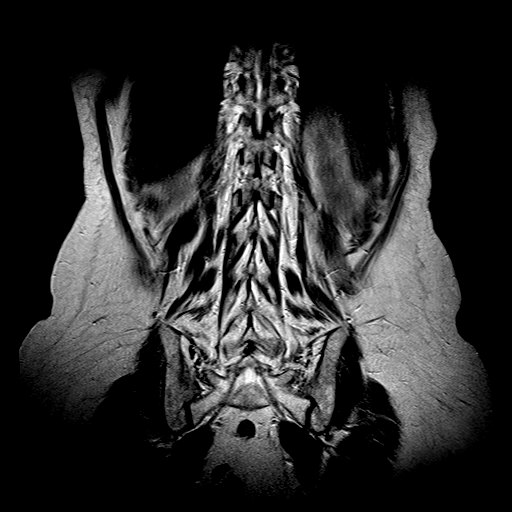
[im 7/18]
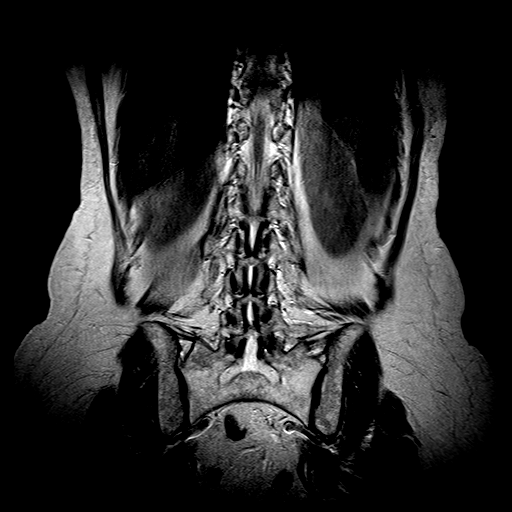
[im 9/18]
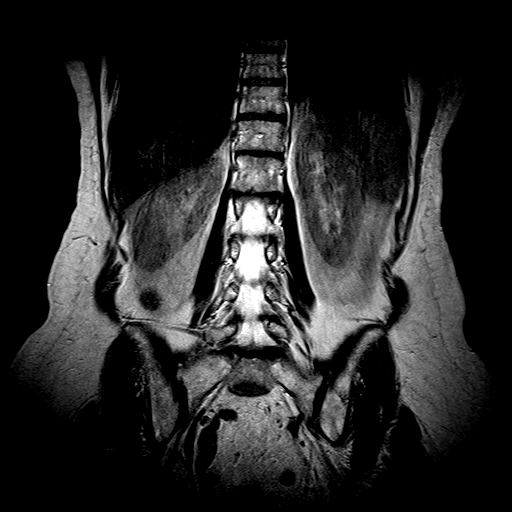
[im 11/18]
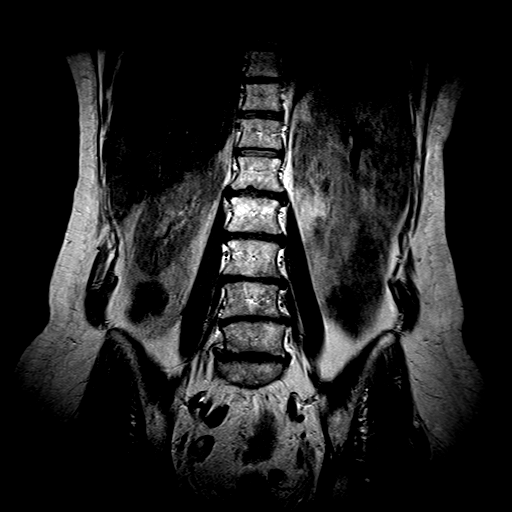
[im 13/18]
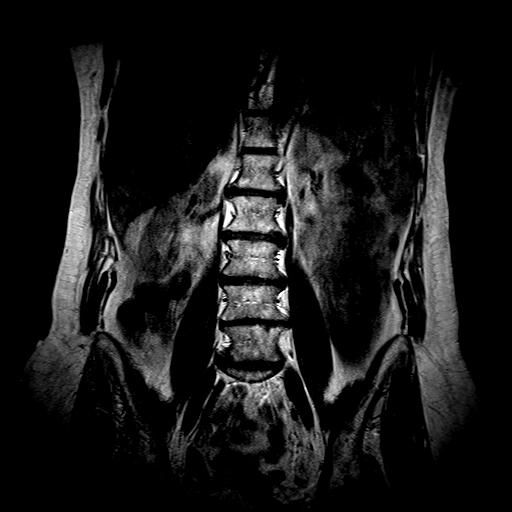
[im 15/18]
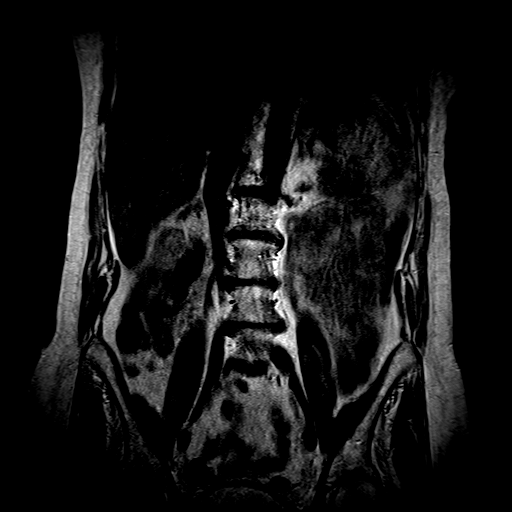
[im 18/18]
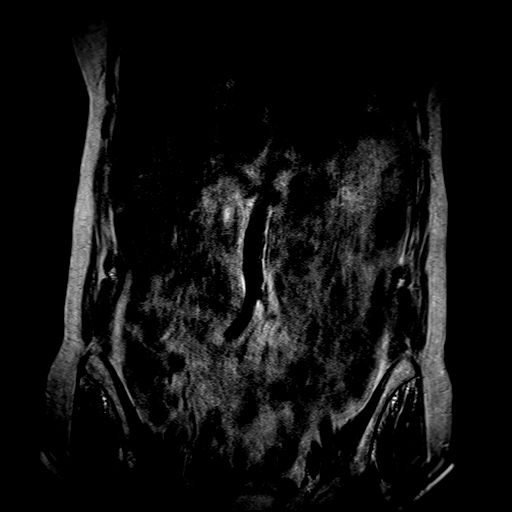

[Series 9: T2 · axial · 4.0mm · 0.52mm/px · z∈[-125,+59]mm · 8 of 22 slices shown (3 of 3)]
[im 1/22]
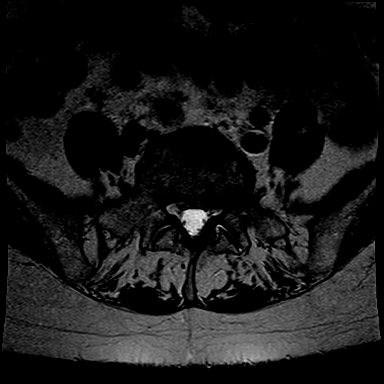
[im 3/22]
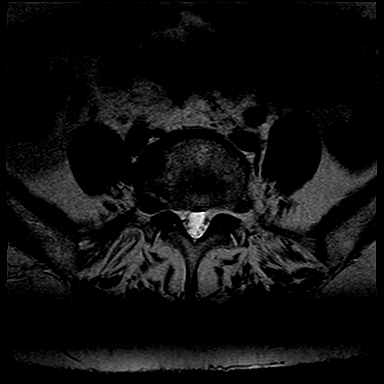
[im 8/22]
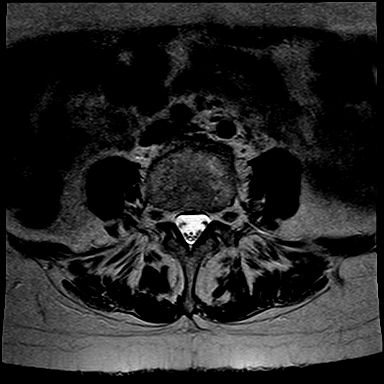
[im 10/22]
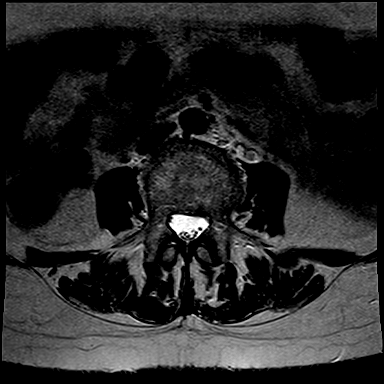
[im 12/22]
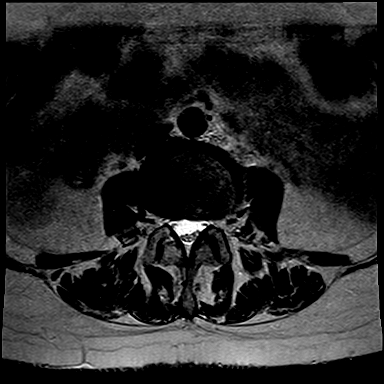
[im 15/22]
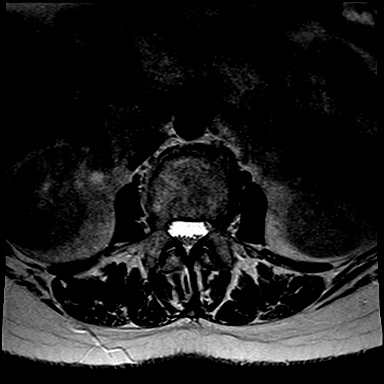
[im 19/22]
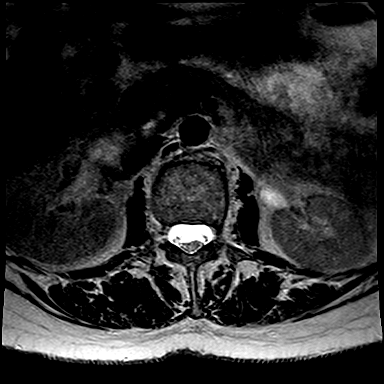
[im 22/22]
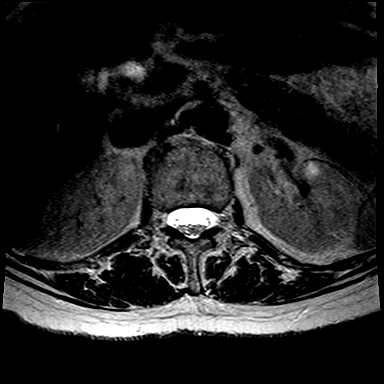

[Series 10: T1 · axial · 4.0mm · 0.52mm/px · z∈[-125,-115]mm · 2 of 22 slices shown (2 of 2)]
[im 1/22]
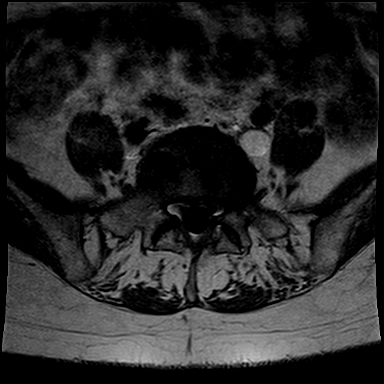
[im 3/22]
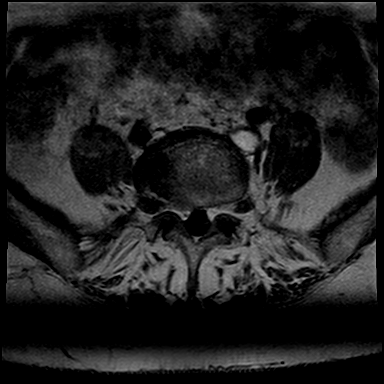

[32 of 48 positions shown; findings below may reference images not displayed]

FINDINGS: There are moderate degenerative changes at multiple levels with disc space narrowing, disc desiccation, and small osteophyte formation.  There is no fracture, malalignment, or abnormality of the conus.  

At L1-L2 and L2-L3, there is minimal disc bulging.  

At L3-L4, there is a small left paracentral focal disc protrusion with slight compression of the thecal sac but no definite nerve root compression.  

At L4-L5, there is mild disc bulging.  

At L5-S1, there is mild disc bulging.  

There is no significant spinal stenosis.
IMPRESSION: 1. Moderate degenerative changes. 

2. No fracture, spinal stenosis, or significant disc herniation.

## 2021-11-15 ENCOUNTER — Encounter (INDEPENDENT_AMBULATORY_CARE_PROVIDER_SITE_OTHER): Payer: Self-pay | Admitting: OTOLARYNGOLOGY

## 2021-11-30 ENCOUNTER — Inpatient Hospital Stay
Admission: RE | Admit: 2021-11-30 | Discharge: 2021-11-30 | Disposition: A | Discharge status: Home / Self Care | Payer: BC Managed Care – PPO | Source: Ambulatory Visit | Attending: NURSE PRACTITIONER | Admitting: NURSE PRACTITIONER

## 2021-11-30 ENCOUNTER — Other Ambulatory Visit (HOSPITAL_COMMUNITY): Payer: Self-pay | Admitting: NURSE PRACTITIONER

## 2021-11-30 ENCOUNTER — Other Ambulatory Visit: Payer: Self-pay

## 2021-11-30 DIAGNOSIS — M5412 Radiculopathy, cervical region: Secondary | ICD-10-CM | POA: Insufficient documentation

## 2021-12-01 ENCOUNTER — Other Ambulatory Visit (HOSPITAL_COMMUNITY): Payer: Self-pay | Admitting: NURSE PRACTITIONER

## 2021-12-01 DIAGNOSIS — M5412 Radiculopathy, cervical region: Secondary | ICD-10-CM

## 2021-12-12 ENCOUNTER — Inpatient Hospital Stay (HOSPITAL_COMMUNITY): Admission: RE | Admit: 2021-12-12 | Payer: BC Managed Care – PPO | Source: Ambulatory Visit

## 2021-12-16 IMAGING — MR MRI CERVICAL SPINE WITHOUT CONTRAST
4 of 5 series · 24 of 48 positions shown · non-contrast
Comparison: None available.

﻿EXAM:  [id number]   MRI CERVICAL SPINE WITHOUT CONTRAST
INDICATION: Chronic neck pain, bilateral shoulder pain and bilateral arm numbness.
TECHNIQUE: Noncontrast multiplanar, multisequence MRI was performed.

[Series 5: T2 · sagittal · 3.0mm · 0.69mm/px · 8 of 13 slices shown (1 of 2)]
[im 1/13]
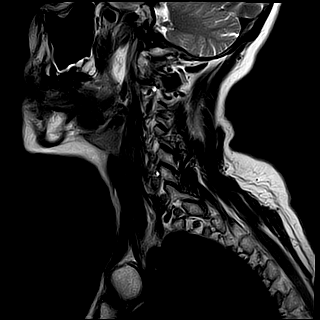
[im 2/13]
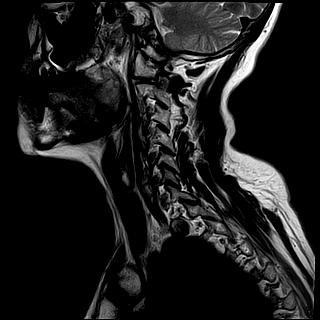
[im 4/13]
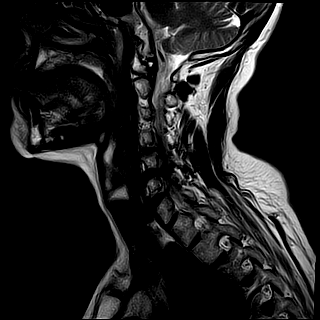
[im 6/13]
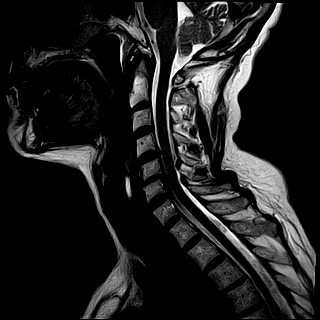
[im 7/13]
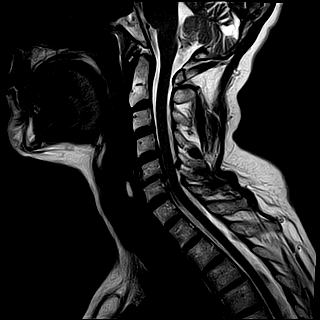
[im 9/13]
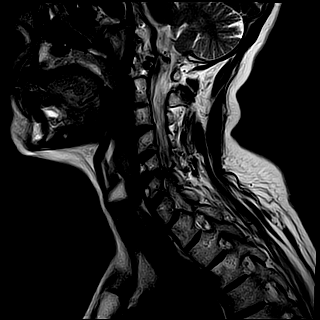
[im 11/13]
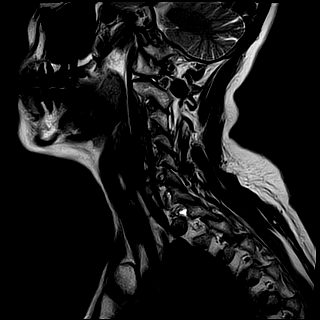
[im 13/13]
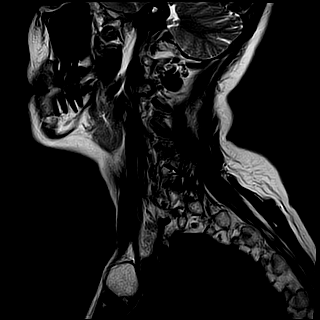

[Series 6: T1 · sagittal · 3.0mm · 0.43mm/px · 4 of 13 slices shown]
[im 1/13]
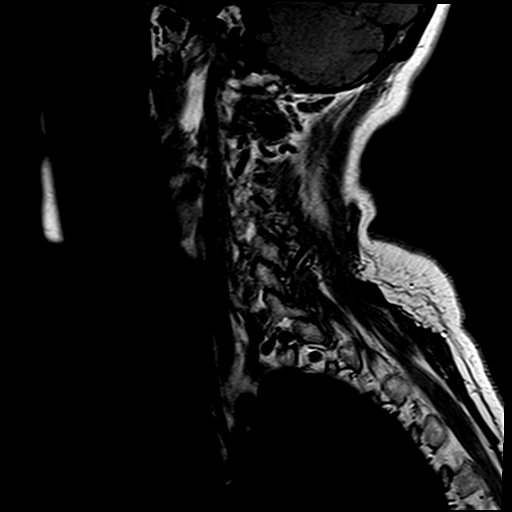
[im 2/13]
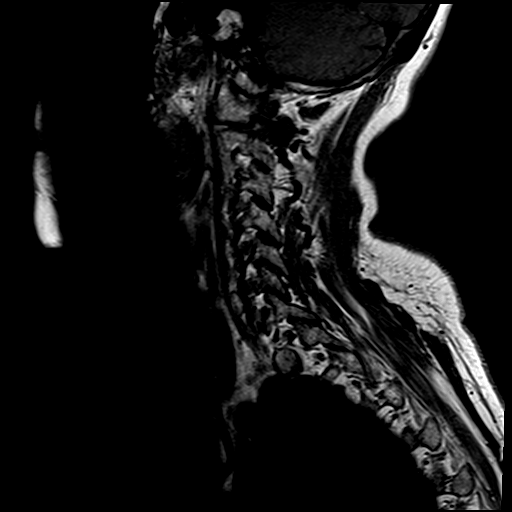
[im 7/13]
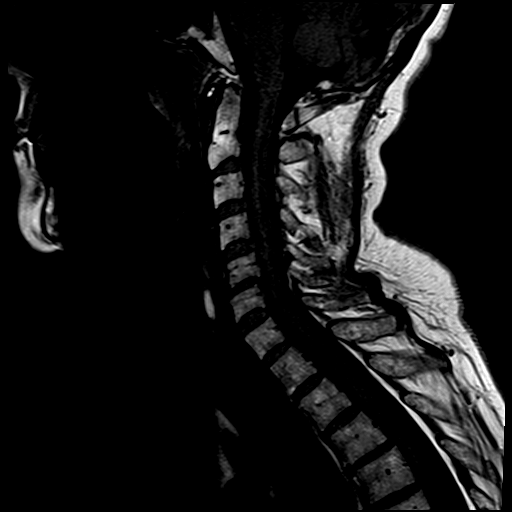
[im 11/13]
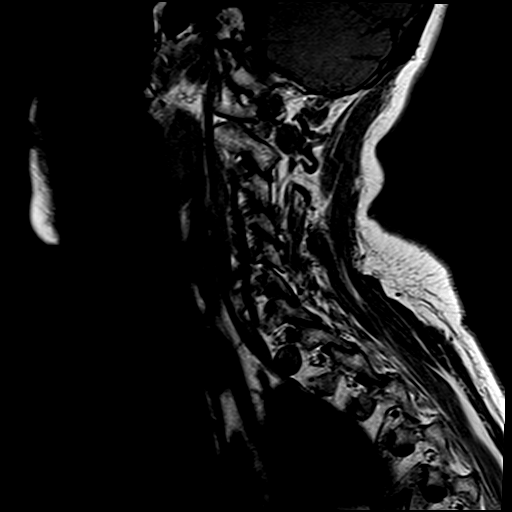

[Series 7: STIR · sagittal · 3.0mm · 0.43mm/px · 3 of 13 slices shown]
[im 2/13]
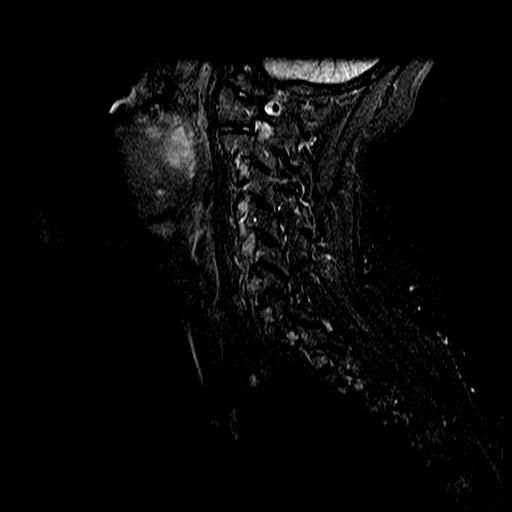
[im 7/13]
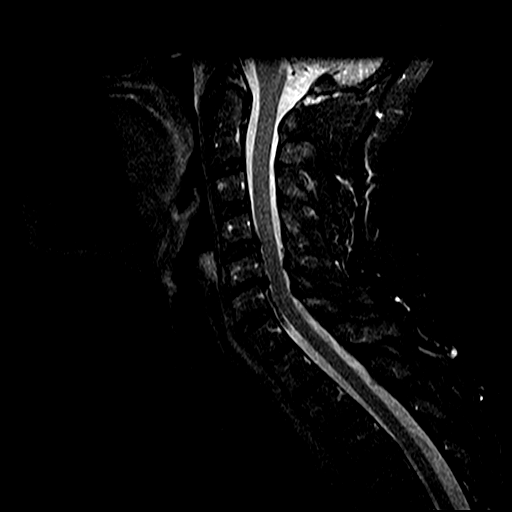
[im 11/13]
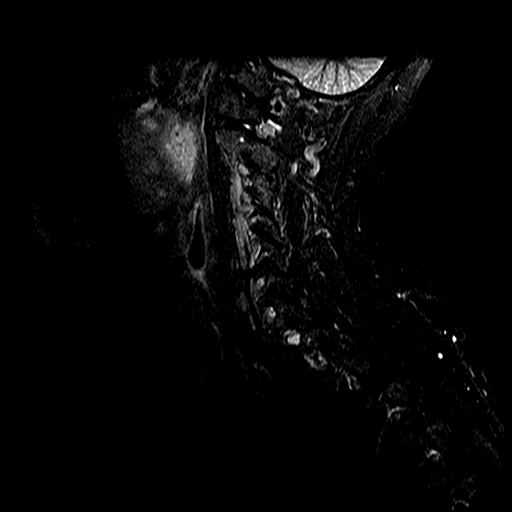

[Series 8: T2 · oblique · 3.0mm · 0.39mm/px · 9 of 18 slices shown (2 of 2)]
[im 1/18]
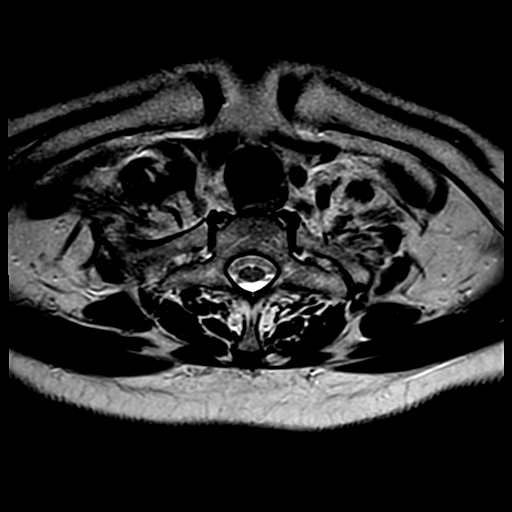
[im 4/18]
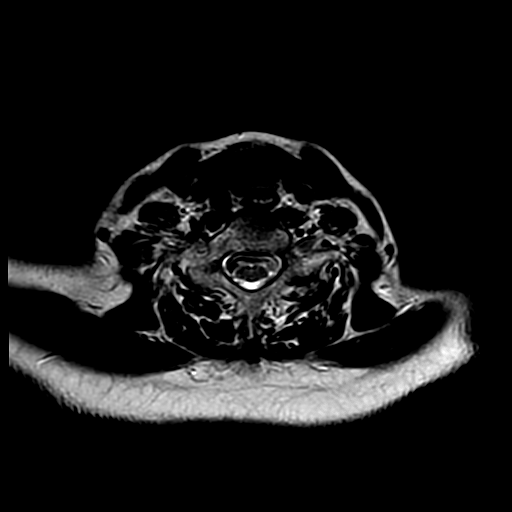
[im 5/18]
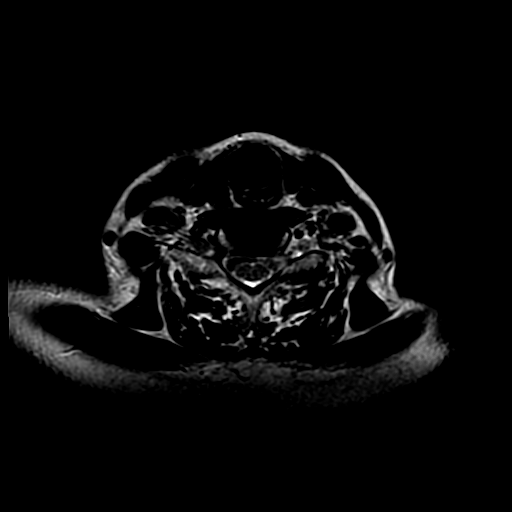
[im 8/18]
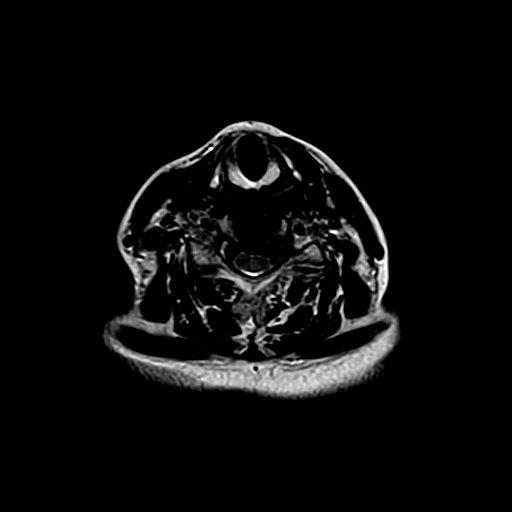
[im 10/18]
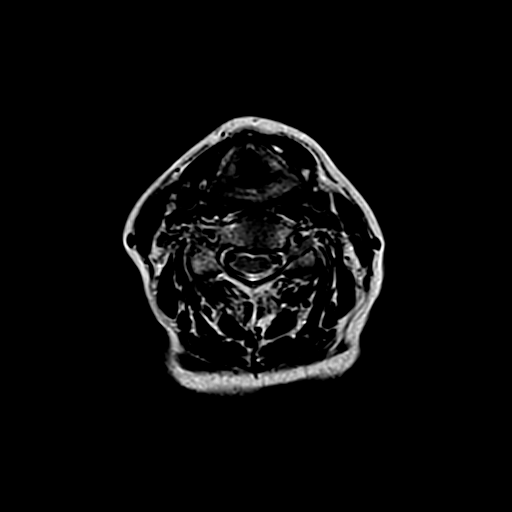
[im 13/18]
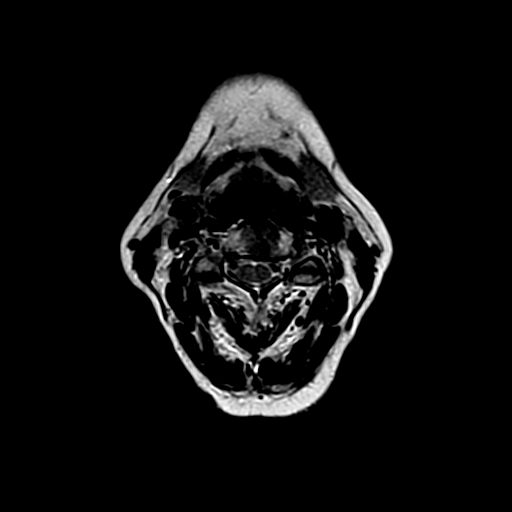
[im 14/18]
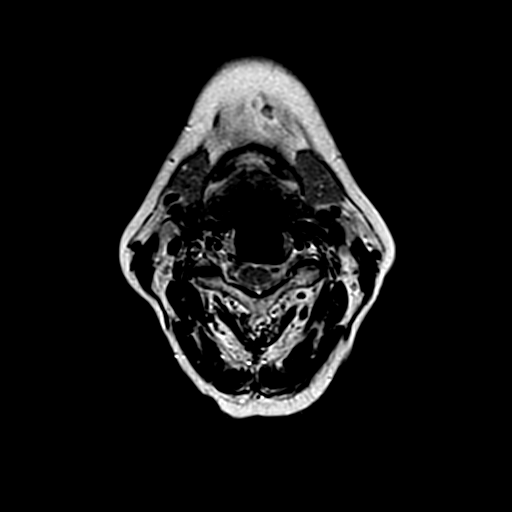
[im 16/18]
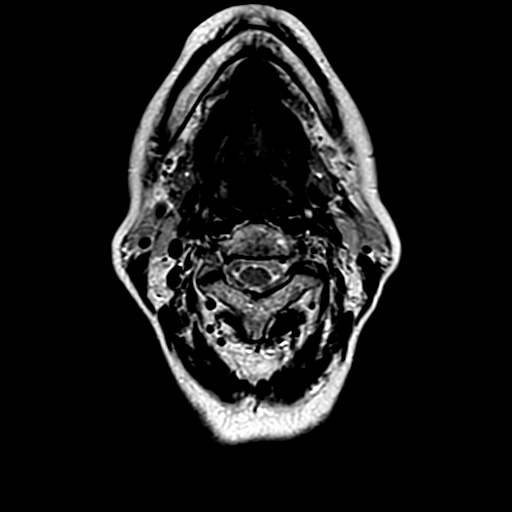
[im 18/18]
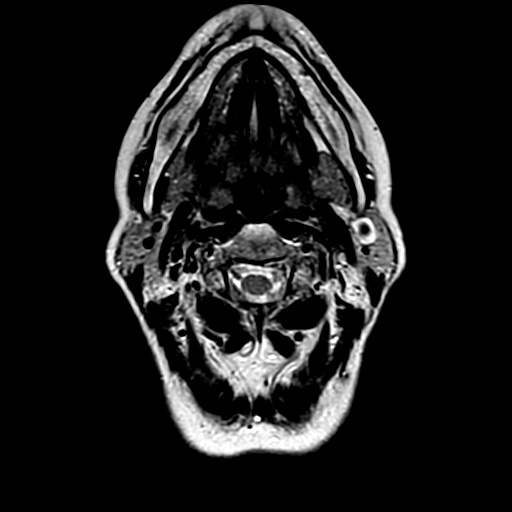

[24 of 48 positions shown; findings below may reference images not displayed]

FINDINGS: There are moderate degenerative changes at C5-C6 with disc space narrowing.  There is a moderate sized disc-osteophyte complex on the left compressing the left side of the thecal sac and narrowing the left C5-C6 neural foramen.  

Mild degenerative changes are noted elsewhere.  There is minimal disc bulging at C4-C5.  Mild subluxation at C5-C6 is compatible with degenerative change.  Alignment is otherwise intact.

There is no fracture or significant marrow signal alteration.  There is mild spinal stenosis at C5-C6.  The spinal cord appears unremarkable.  There is no Chiari malformation.
IMPRESSION: Moderate degenerative changes at C5-C6 with a left-sided disc-osteophyte complex, mild spinal stenosis, and mild subluxation.

## 2021-12-25 ENCOUNTER — Other Ambulatory Visit: Payer: Self-pay

## 2022-01-09 ENCOUNTER — Encounter (INDEPENDENT_AMBULATORY_CARE_PROVIDER_SITE_OTHER): Payer: Self-pay | Admitting: OTOLARYNGOLOGY

## 2022-01-17 ENCOUNTER — Other Ambulatory Visit: Payer: Self-pay

## 2022-01-17 ENCOUNTER — Encounter (INDEPENDENT_AMBULATORY_CARE_PROVIDER_SITE_OTHER): Payer: Self-pay | Admitting: OTOLARYNGOLOGY

## 2022-01-17 ENCOUNTER — Ambulatory Visit (INDEPENDENT_AMBULATORY_CARE_PROVIDER_SITE_OTHER): Payer: BC Managed Care – PPO | Admitting: OTOLARYNGOLOGY

## 2022-01-17 VITALS — Ht 68.0 in | Wt 170.0 lb

## 2022-01-17 DIAGNOSIS — J301 Allergic rhinitis due to pollen: Secondary | ICD-10-CM

## 2022-01-17 DIAGNOSIS — J329 Chronic sinusitis, unspecified: Secondary | ICD-10-CM

## 2022-01-17 DIAGNOSIS — H903 Sensorineural hearing loss, bilateral: Secondary | ICD-10-CM

## 2022-01-17 DIAGNOSIS — J3089 Other allergic rhinitis: Secondary | ICD-10-CM

## 2022-01-17 NOTE — Procedures (Signed)
ENT, [facility]  [address]  [place] [address]    Procedure Note    Name: Mckenzie Stewart MRN:  [id number]   Date: 01/17/2022 Age: 66 y.o.  DOB:   03-Apr-1955       [id number] - NASAL ENDOSCOPY DIAGNOSTIC UNILATERAL OR BILATERAL (AMB ONLY)    Performed by: Dia Sitter, DO  Authorized by: Dia Sitter, DO    Time Out:     Immediately before the procedure, a time out was called:  Yes    Patient verified:  Yes    Procedure Verified:  Yes    Site Verified:  Yes  Documentation:      Indications for procedure: Obstructive nasal breathing    Anesthesia: Oxymetazoline nasal spray    Description: Nasal endoscopy with rigid scope was performed with examination of the  septum, inferior, middle, and superior meatus, turbinates, sphenoethmoidal recess, and nasopharynx.     There were no polyps, pus, or granulation tissue noted.  ET orifices and nasopharynx were normal.     Findings: Allergic rhinitis    The patient tolerated the procedure well.               Dia Sitter, DO

## 2022-01-17 NOTE — H&P (Signed)
ENT, [facility]  [place] [address]  Phone: [phone]  Fax: [phone]      Encounter Date: 01/17/2022    Patient ID: Mckenzie Stewart  MRN: [id number]    DOB: 1955-07-20  Age: 66 y.o. female     Progress Note       Referring Provider:  No ref. provider found    Reason for Visit:   Chief Complaint   Patient presents with    Follow Up 3 Months     3 month rc on allergies. Stopped allergy injections due to having COPD complications.        History of Present Illness:  Mckenzie Stewart is a 66 y.o. female who is FU on AR. Pt states the allergy shots provoked COPD exacerbations, and she has discontinued the allergy shots. She has a cat. She is reluctant to take any allergy meds or nasal sprays other than saline.       Patient History:  There is no problem list on file for this patient.    Current Outpatient Medications   Medication Sig    ascorbic acid (VITAMIN C) 1,000 mg Oral Tablet Take 1 Tablet (1,000 mg total) by mouth Once a day    azelastine (ASTELIN) 137 mcg (0.1 %) Nasal Aerosol, Spray Administer 1 Spray into each nostril Twice daily Use in each nostril as directed    BREZTRI AEROSPHERE 160-9-4.8 mcg/actuation Inhalation HFA Aerosol Inhaler     calcium citrate-vitamin D3 (CITRACAL) 200 mg-6.25 mcg (250 unit) Oral Tablet Take by mouth Once a day    chlorpheniramine maleate (CHLORPHEN SR ORAL) Take by mouth    cholecalciferol, vitamin D3, 250 mcg (10,000 unit) Oral Capsule Take 1 Capsule (10,000 Units total) by mouth Once a day    EPINEPHrine 0.3 mg/0.3 mL Injection Auto-Injector Inject 0.3 mL (0.3 mg total) into the muscle Once, as needed for up to 1 dose    guaiFENesin 100 mg/5 mL Oral Liquid Take by mouth Every 4 hours as needed    hydrOXYzine pamoate (VISTARIL) 100 mg Oral Capsule Take 1 Capsule (100 mg total) by mouth Three times a day as needed for Itching    pediatric multivitamins Oral Tablet, Chewable Chew 1 Tablet Once a day    Triamcinolone Acetonide (NASACORT) 55 mcg  Nasal Aerosol, Spray Administer 2 Sprays into affected nostril(s) Once a day    trimethoprim-sulfamethoxazole (BACTRIM DS) 160-800mg  per tablet Take 1 Tablet (160 mg total) by mouth Twice daily    turmeric/turmeric ext/pepr ext (TURMERIC-TURMERIC EXT-PEPPER) 500-3 mg Oral Capsule Take by mouth      No Known Allergies  Past Medical History:   Diagnosis Date    COPD (chronic obstructive pulmonary disease) (CMS HCC)      History reviewed. No pertinent surgical history.  Family Medical History:       Problem Relation (Age of Onset)    Breast Cancer Maternal Aunt            Social History     Tobacco Use    Smoking status: Never    Smokeless tobacco: Never       Review of Systems:  Review of Systems    Physical Exam:  Ht 1.727 m (5\' 8" )   Wt 77.1 kg (170 lb)   BMI 25.85 kg/m       Physical Exam  Constitutional:       Appearance: Normal appearance. She is well-developed, well-groomed and normal weight.   HENT:  Head: Normocephalic and atraumatic.      Right Ear: Hearing, tympanic membrane, ear canal and external ear normal.      Left Ear: Hearing, tympanic membrane, ear canal and external ear normal.      Nose: Septal deviation and mucosal edema present.      Right Turbinates: Enlarged.      Left Turbinates: Enlarged.      Mouth/Throat:      Lips: Pink.      Mouth: Mucous membranes are moist.      Pharynx: Oropharynx is clear. Uvula midline.   Eyes:      Extraocular Movements: Extraocular movements intact.   Neck:      Trachea: Phonation normal.   Pulmonary:      Effort: Pulmonary effort is normal.   Musculoskeletal:      Cervical back: Normal range of motion and neck supple.   Lymphadenopathy:      Cervical: No cervical adenopathy.   Skin:     General: Skin is warm.   Neurological:      Mental Status: She is alert and oriented to person, place, and time.      Cranial Nerves: Cranial nerves 2-12 are intact. No facial asymmetry.   Psychiatric:         Attention and Perception: Attention normal.         Mood and  Affect: Mood normal.         Speech: Speech normal.         Behavior: Behavior normal. Behavior is cooperative.         Assessment:  ENCOUNTER DIAGNOSES     ICD-10-CM   1. Seasonal allergic rhinitis due to pollen  J30.1   2. Chronic sinusitis, unspecified location  J32.9   3. Sensorineural hearing loss (SNHL) of both ears  H90.3   4. 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  Stopped immunotherapy. Cont saline. Continue allegra  Orders Placed This Encounter    [id number] - NASAL ENDOSCOPY DIAGNOSTIC UNILATERAL OR BILATERAL (AMB ONLY)     Return in about 6 months (around 07/18/2022).    Marcelline Deist, PA-C  01/17/2022, 12:08 The advanced practice clinician's documentation was reviewed/amended in its entirety with the assessment and plan portion completely performed independently by me during this separate encounter.

## 2022-02-08 ENCOUNTER — Other Ambulatory Visit: Payer: Self-pay

## 2022-02-08 ENCOUNTER — Ambulatory Visit (HOSPITAL_COMMUNITY)
Admission: RE | Admit: 2022-02-08 | Discharge: 2022-02-08 | Disposition: A | Discharge status: Still In Hospital | Payer: BC Managed Care – PPO | Source: Ambulatory Visit | Attending: Orthopaedic Surgery | Admitting: Orthopaedic Surgery

## 2022-02-08 DIAGNOSIS — M542 Cervicalgia: Secondary | ICD-10-CM | POA: Insufficient documentation

## 2022-02-08 DIAGNOSIS — M545 Low back pain, unspecified: Secondary | ICD-10-CM | POA: Insufficient documentation

## 2022-02-08 NOTE — PT Evaluation (Signed)
[facility] Medicine [facility]  Outpatient Physical Therapy  [address]  [place], [address]  [phone]  (Fax) [phone]      Physical Therapy Cervical Evaluation    Date: 02/08/2022  Patient's Name: Mckenzie Stewart  Date of Birth: [birth year]    PT diagnosis/Reason for Referral: neck pain, back pain      SUBJECTIVE  Subjective: 66 year old female referred for neck and back pain. Patient reports her neck pain is far worse than her back pain. Back pain has been persistent on and off since 2007. We agreed to focus on the new onset neck pain. Patient reports she was lifting 20-30 pound granddaughter up into a hanging swing. This is when she thinks the neck pain started. She does endorse some radicular pain into right shoulder. She has had a little neck pain for several years. She feels the cracking/ crunching when she rotates her head as well. 2 really bad flare ups requiring pain shots and steroids. Patient does report falling a few times due to clumsiness.    Date of onset: Late summer     Mechanism of injury: lifting 20-30 pound grandchild     Previous episodes/treatments: neck pain since summer, injections, no mechanical treatment     Pain location: cervical spine             Pain rating: Now 1/10   Best 1/10   Worst 10/10 (during bad flare ups)     Pain description: SHOOTING, BURNING, and deep, intense     Pain frequency:  CONTINUOUS    Pain increases with: LIFTING and sleeping, lying in poor position           decreases with : HEAT, COLD, MEDICATION, POSITION CHANGE, and injections     Medications for this problem: anti-inflammatory    Diagnostic tests: IMPRESSION:  MILD TO MODERATE CERVICAL DEGENERATIVE CHANGES.    MRI done: orders not present             Radiculopathy: Yes    Sensation: Yes    Weakness: Yes    Sleep affected: Yes    Subjective Functional Reports:    Sitting: LIMITED    Standing: LIMITED    Walking: LIMITED    Lifting: LIMITED    Next MD visit: mid  december    Occupation:  retired retailo     Patient goals: REDUCE PAIN and NORMALIZE FUNCTION    Past Medical History:   Diagnosis Date    COPD (chronic obstructive pulmonary disease) (CMS HCC)    - hypertension   - osteopenia   - denies any other issues with internal organs     OBJECTIVE      Reflexes  Biceps 2+ bilat   Triceps 2+ bilat   Brachioradialis 2+ bilat     UMN S&S:  - Hoffman: neg  - Babinski: neg  - Clonus: neg     Cervical ROM   right left   rotation 60 65   sidebend 25 38   flexion 64 -------------------------   extension 36 -------------------------     Lumbar ROM not tested due to time constraints and patient emphasis on cervical spine     Thoracic spine and lumbar spine hypomobile with prone PA testing g2-3 and painful     Sciatic nerve tension with passive SLR  LBP with prone Ely's testing     Strength: based on traditional */5 scale  - Myotomal testing WNL EXCLUDING:  - L shoulder ER:  4/5  - L shoulder IR: 4+/5   - this is non-dominant arm and does not indicate (without lack of othe rcervical dysfunction or findings) nerve issues. It is likely non-dominant arm rotator cuff weakness     Palpation: tenderness to palpation along cervical spine paraspinals     Special tests: unchecked sections indicate specific test was not performed  Cervical Special Tests Left Right   Compression Positive Negative   []  [x]     Positive Negative   []  [x]       Distraction Positive Negative   []  [x]     Positive Negative   []  [x]       Sharps pursor Positive Negative   []  [x]     Positive Negative   []  [x]       C2 Kick Positive Negative   []  [x]     Positive Negative   []  [x]       Supine Side glides Positive Negative   []  []     Positive Negative   []  []       Prone PA  Positive Negative   []  []     Positive Negative   []  []       Prone Unilat PA  Positive Negative   []  []     Positive Negative   []  []            Treatment provided:REVIEW OF POC AND GOALS WITH PATIENT, ALL QUESTIONS ANSWERED, PATIENT EDUCATION, and ADL/patient  ed.   - Pt ed on Dx, Px, POC, and outcomes  - HEP provided with education    Access Code:  URL: [email or web address]  Date: 02/08/2022  Prepared by:    Exercises  - Supine Cervical Retraction with Towel  - 2-3 x daily - 10 reps - 5 second hold  - Sidelying Thoracic Rotation with Open Book  - 2-3 x daily - 10 reps  - Seated Thoracic Lumbar Extension  - 2-3 x daily - 10 reps  ASSESSMENT    Impression: Varshini Arrants presents with both neck and back pain. Back evaluation was limited due to time constraints. Will further assess as we progress. She does have cervical spine pain, cervical spine hypomobility, thoracic hypomobility, and overall decreased functional mobility. She also has signs and symptoms of sciatic nerve tension and decreased LE anterior chain mobility. Patient will benefit from skilled PT intervention.     Rehab potential: GOOD    GOALS:  Short Term Goals: 4 weeks    - Patient will reduce max cervical pain by 2/10  - Patient will be independent with HEP     Long Term Goals: 8 weeks    - Improve cervical extension ROM to 45+ degrees to improve navigation of environment  - Improve cervical RSB ROM to 35+ degrees to improve cervical ROM   - Improve prone knee flexion to 125 deg + without exacerbation of LBP  - Eliminate (+) sciatic neural tension testing       PLAN  POC: 2x/ 8weeks    Therapy may include, but is not limited to THERAPEUTIC EXERCISES, MYOFASCIAL/JOINT MOBILIZATION, POSTURE/BODY MECHANICS, ERGONOMIC TRAINING, TRANSFER/GAIT TRAINING, HOME INSTRUCTIONS, HEAT/COLD, ULTRASOUND, ELECTRICAL STIMULATION, KINESIOTAPE, NEURO RE-EDUCATIOIN, and therapeutic activity     Plan for next visit: Review and update HEP with sciatic nerve glides and anterior chain stretching, full body warm up, THORACIC mobility, cervical activation, periscapular stabilization        Evaluation complexity:   Personal factors impacting POC: EXTREME YOUTH OR ADVANCED AGE and FREQUENT OR CHRONIC  PAIN   Co-morbidities impacting POC:  COPD, HTN, osteopenia  Complexity of physical exam: INCLUDING MUSCULOSKELETAL SYSTEM (POSTURE, ROM, STRENGTH, HEIGHT/WEIGHT), INCLUDING NEUROMUSCULAR EXAM (BALANCE, GAIT, LOCOMOTION, MOBILITY), INCLUDING ACTIVITY/MOBILITY RESTRICTIONS, and REFERRAL IS FOR A CHRONIC PROBLEM   Clinical Presentation: EVOLVING WITH CHANGING CLINICAL CHARACTERISTICS   Evaluation Complexity: MODERATE-HISTORY 1-2, EXAMINATION 3+, PRESENTATION  EVOLVING/CHANGING        Total Session Time 40, Timed code minutes 10, and Untimed code minutes 30        Intervention minutes: EVALUATION 30 minutes and ADL/IADL TRAINING 10 minutes    Luberta Robertson, PT  02/08/2022, 08:51        Start of Service: _________          Certification:    From:______  Through:_________    I certify the need for these services furnished under this plan of treatment and while under my care.    Referring Provider Signature: _______________     Date : _____________________

## 2022-02-09 ENCOUNTER — Other Ambulatory Visit (HOSPITAL_COMMUNITY): Payer: Self-pay | Admitting: Orthopaedic Surgery

## 2022-02-09 DIAGNOSIS — M545 Low back pain, unspecified: Secondary | ICD-10-CM

## 2022-02-09 DIAGNOSIS — M542 Cervicalgia: Secondary | ICD-10-CM

## 2022-02-14 ENCOUNTER — Ambulatory Visit (HOSPITAL_COMMUNITY)
Admission: RE | Admit: 2022-02-14 | Discharge: 2022-02-14 | Disposition: A | Discharge status: Still In Hospital | Payer: BC Managed Care – PPO | Source: Ambulatory Visit | Attending: Orthopaedic Surgery | Admitting: Orthopaedic Surgery

## 2022-02-14 ENCOUNTER — Other Ambulatory Visit: Payer: Self-pay

## 2022-02-14 NOTE — PT Treatment (Signed)
[facility] Medicine [facility]  Outpatient Physical Therapy  [address]  [place], [address]  [phone]  (Fax) [phone]    Physical Therapy Treatment Note    Date: 02/14/2022  Patient's Name: Mckenzie Stewart  Date of Birth: [birth year]            Visit #/POC: 2/16  Authorization: med Arther Dames  POC Signed?: no  POC Ends: 04/05/22  Next Progress Note Due: 10th visit or 30 days from eval      Evaluating Physical Therapist: Luberta Robertson, PT, DPT  PT diagnosis/Reason for Referral: neck pain  Next Scheduled Physician Appointment: mid december  Allergies/Contraindications: n/a          Subjective: Patient denies sciatic nerve pain since last visit and has also worked in her yard with BUE overhead having no major pain issues. She feels as though her symptoms will just come and go and this is most likely the reason she has not had pain since eval.  Upon arrival she is reporting discomfort in cervical and C/T junction with tightness.    Objective: treatment delivered as noted below    Measured ROM: not assessed. Refer to eval  EXERCISE/ACTIVITY NAME REPETITIONS RESISTANCE COMPLETED THIS DOS   Cervical distraction   gentle  y   Manual cervical stretching   For soft tissue  y   Bilat up/down slides   Gentle grade 2  y                                                           Assessment: Much tightness noted in bilat UT. She feels to have hypomobility in C/T junction. Post treatment she reports feeling less tightness with cervical AROM.     Plan:   Next visit will perform MFR to bilat UT and surrounding soft tissue of shoulders and scaps, kinesio tape to inhibit muscles as warranted, then gentle mobs to thoracic spine and perform U/T mob with cervical AROM.     Total Session Time 40, Timed code minutes 35, and Untimed code minutes 0  JOINT MOBILIZATION/MFR 35 minutes      Tarri Abernethy, PTA  02/14/2022, 13:40

## 2022-02-17 ENCOUNTER — Other Ambulatory Visit: Payer: Self-pay

## 2022-02-17 ENCOUNTER — Ambulatory Visit (HOSPITAL_COMMUNITY)
Admission: RE | Admit: 2022-02-17 | Discharge: 2022-02-17 | Disposition: A | Discharge status: Still In Hospital | Payer: BC Managed Care – PPO | Source: Ambulatory Visit | Attending: Orthopaedic Surgery | Admitting: Orthopaedic Surgery

## 2022-02-17 NOTE — PT Treatment (Signed)
[facility] Medicine [facility]  Outpatient Physical Therapy  [address]  [place], [address]  [phone]  (Fax) [phone]    Physical Therapy Treatment Note    Date: 02/17/2022  Patient's Name: Mckenzie Stewart  Date of Birth: [birth year]            Visit #/POC: 3/16  Authorization: med [employer]ther Dames  POC Signed?: no  POC Ends: 04/05/22  Next Progress Note Due: 10th visit or 30 days from eval      Evaluating Physical Therapist: Luberta Robertson, PT, DPT  PT diagnosis/Reason for Referral: neck pain  Next Scheduled Physician Appointment: mid december  Allergies/Contraindications: n/a          Subjective: Patient reports her response to last visit was poor, she reports by that night pain increased to 4/10, yesterday morning 3-4/10 and then by yesterday afternoon 6/10, 7-8/10 by bed time. She states she woke this morning with 0/10 pain until she was up for a while. She states she has infrared light at home that she uses for LB and she can try it on upper back.    Objective: treatment delivered as noted below    Measured ROM: not assessed. Refer to eval  EXERCISE/ACTIVITY NAME REPETITIONS RESISTANCE COMPLETED THIS DOS   Cervical distraction   gentle  n   Manual cervical stretching   For soft tissue  n   Bilat up/down slides   Gentle grade 2  n   MFR: mid and upper back  Fascial release: mid and upper back   gentle L sidelying  L sidelying Y  y                                                   Assessment: Secondary to patient's poor tolerance to last visit gentle MFR only was performed to address soft tissue tightness. Post treatment she does report feeling less tight/more relaxed.     Plan:   Assess response to treatment, if response is good will initiate kinesio taping to reduce soft tissue tightness and posture correction.     Total Session Time 40, Timed code minutes 20, and Untimed code minutes 0  JOINT MOBILIZATION/MFR 20 minutes      Tarri Abernethy, PTA  02/17/2022 16:56

## 2022-02-20 ENCOUNTER — Ambulatory Visit (HOSPITAL_COMMUNITY)
Admission: RE | Admit: 2022-02-20 | Discharge: 2022-02-20 | Disposition: A | Discharge status: Still In Hospital | Payer: BC Managed Care – PPO | Source: Ambulatory Visit | Attending: Orthopaedic Surgery | Admitting: Orthopaedic Surgery

## 2022-02-20 ENCOUNTER — Other Ambulatory Visit: Payer: Self-pay

## 2022-02-20 NOTE — PT Treatment (Signed)
[facility] Medicine [facility]  Outpatient Physical Therapy  [address]  , [address]  [phone]  (Fax) [phone]    Physical Therapy Treatment Note    Date: 02/20/2022  Patient's Name: Mckenzie Stewart  Date of Birth: 1956-02-21      Visit #/POC: 4/16  Authorization: med [employer]ther Dames  POC Signed?: no  POC Ends: 04/05/22  Next Progress Note Due: 10th visit or 30 days from eval        Evaluating Physical Therapist: Luberta Robertson, PT, DPT  PT diagnosis/Reason for Referral: neck pain  Next Scheduled Physician Appointment: mid december  Allergies/Contraindications: n/a             Subjective: States she was very painful yesterday.  Notes exercises seem to flare up her pain.  States she did try her "red light" machine on her neck this weekend.  Rates pain 2-3/10 today.  States she is sleeping well but notes she takes medicine to sleep.      Objective: Activities as noted below    Measured ROM: not measured today    EXERCISE/ACTIVITY NAME REPETITIONS RESISTANCE COMPLETED THIS DOS   Cervical distraction    gentle   n   Manual cervical stretching    For soft tissue   n   Bilat up/down slides    Gentle grade 2   n   MFR: mid and upper back  Fascial release: mid and upper back   layer 1 fascial release bilateral from suboccipital to upper trap gentle Supine  Supine  Y  Y  y    assisted sub cranial mobility      y                                                                  Assessment: Pt tolerated well.  Noted some relief of tightness after session.  Pt noted to have fascial restrictions but too tender to determine facet restrictions.     GOALS:  Short Term Goals: 4 weeks     - Patient will reduce max cervical pain by 2/10  - Patient will be independent with HEP      Long Term Goals: 8 weeks     - Improve cervical extension ROM to 45+ degrees to improve navigation of environment  - Improve cervical RSB ROM to 35+ degrees to improve cervical ROM   - Improve prone knee flexion to 125 deg +  without exacerbation of LBP  - Eliminate (+) sciatic neural tension testing          Plan: Will monitor effects of treatment and proceed accordingly    Total Session Time 35 and Timed code minutes 35  THERAPEUTIC EXERCISE 15 minutes and JOINT MOBILIZATION/MFR 20 minutes      Sai Zinn, PTA  02/20/2022, 14:53

## 2022-02-22 ENCOUNTER — Ambulatory Visit (HOSPITAL_COMMUNITY)
Admission: RE | Admit: 2022-02-22 | Discharge: 2022-02-22 | Disposition: A | Discharge status: Still In Hospital | Payer: BC Managed Care – PPO | Source: Ambulatory Visit | Attending: Orthopaedic Surgery | Admitting: Orthopaedic Surgery

## 2022-02-22 NOTE — PT Treatment (Signed)
[facility] Medicine [facility]  Outpatient Physical Therapy  [address]  [place], [address]  [phone]  (Fax) [phone]    Physical Therapy Treatment Note    Date: 02/22/2022  Patient's Name: YUKA LALLIER  Date of Birth: [birth year]      Visit #/POC: 5/16  Authorization: med [employer]ther Dames  POC Signed?: no  POC Ends: 04/05/22  Next Progress Note Due: 10th visit or 30 days from eval     Evaluating Physical Therapist: Luberta Robertson, PT, DPT  PT diagnosis/Reason for Referral: neck pain  Next Scheduled Physician Appointment: mid december  Allergies/Contraindications: n/a       Subjective: Melessa reports that she has been responding positively to the manual therapy EXCLUDING the cervical distraction and side bending stretching. This caused her a lot of pain for about 2-3 days. The soft tissue work in her neck and low back did seem to help though. She reports her back is fine today with very little issue (almost no pain). The neck is still irritated. She reports she has shakiness in both of her hands when she attempts to hold something in them. She demonstrated by holding her purse at her side.     Objective: Activities as noted below    Measured ROM:     Grip Strength  - R 70, 65, 66: avg 67#  - L 65, 56, 66: avg 62.3  - Age related norms: (from "Hand Grip Strength: age and gender stratified normative data in a population-based study")  Gender: female  Age: 68-69  R: 24 kg (52.8#)  L: 23 kg (50.6#)    Key pinch  - R 11, 12, 12: avg 11.7#  - L 12, 6, 8: 8.7#    Finger tip Pinch:   - R 6, 9. 9: avg 8#  - L 4, 6, 4: avg: 4.7#     EXERCISE/ACTIVITY NAME REPETITIONS RESISTANCE COMPLETED THIS DOS   Manual Therapy: 15 min  - STM to cervical paraspinals, upper trap, lev scap, scalenes  - Cervical distraction g1-2 to test patient tolerance (no sustained hold)  - manual range of motion to assess response  - suboccipital release        Y   Cat Cow X5 (poor control)  Increase reps  Y   Seated thoracic  rotation X5 each alternating  Y   Median/ ulnar nerve glides X10 each   (Increase to 20)   Y                                                 ADL/patient ed: 20 min  - assessment and discussing regarding UE/ hand tremors, grip strength, key pinch, finger tip pinch strength  - discussed age related norms to grip strength  - discussed/ added nerve glides to HEP   - discussed potential necessity for manual and mechanical traction if it can be tolerated if introduced more slowly and attmepted to help patient differentiate between muscle, joint, and nerve pain                    Assessment: Nery tolerated treatment well today. She did not have an adverse response to low level manual traction oscillations today versus distraciton holds previously. We updated most of her HEP due to reports of significantly increasing pain to 2 of the 3 exercises she  had. Nerve glides were also added due to the reports of UE tremors she gets when carrying items. Initial eval reviewed for UMN issues, which were negative. Tremors are bilateral. We will continue to monitor these symptoms. Grip and pinch strength measurements were taken with new reports of hand symptoms. She did appear to have above average grip strength for age related norms, but both key and tip pinch seemed to be weak. Norms not as readily available for this info, and her left was weaker than her right. Further intervention is necessary.     GOALS:  Short Term Goals: 4 weeks     - Patient will reduce max cervical pain by 2/10  - Patient will be independent with HEP      Long Term Goals: 8 weeks     - Improve cervical extension ROM to 45+ degrees to improve navigation of environment  - Improve cervical RSB ROM to 35+ degrees to improve cervical ROM   - Improve prone knee flexion to 125 deg + without exacerbation of LBP  - Eliminate (+) sciatic neural tension testing          Plan: Continue POC per initial evaluation and progress the patient as necessary.      Total  Session Time 45 and Timed code minutes 45  THERAPEUTIC EXERCISE 0 minutes and JOINT MOBILIZATION/MFR 15 minutesNMR: 10 min, ADL/patient ed: 20 min      Luberta Robertson, PT  02/22/2022 15:29

## 2022-02-28 ENCOUNTER — Ambulatory Visit (HOSPITAL_COMMUNITY): Payer: Self-pay

## 2022-03-02 ENCOUNTER — Other Ambulatory Visit: Payer: Self-pay

## 2022-03-02 ENCOUNTER — Ambulatory Visit
Admission: RE | Admit: 2022-03-02 | Discharge: 2022-03-02 | Disposition: A | Discharge status: Still In Hospital | Payer: BC Managed Care – PPO | Source: Ambulatory Visit | Attending: Orthopaedic Surgery | Admitting: Orthopaedic Surgery

## 2022-03-02 NOTE — PT Treatment (Addendum)
[facility]  Outpatient Physical Therapy  [place], [address]  [phone]  [phone]    Physical Therapy Treatment Note    Date: 03/02/2022  Patient's Name: Mckenzie Stewart  Date of Birth: 07-05-1955      Visit #/POC: 5/16  Authorization: med Delma Post  POC Signed?: no  POC Ends: 04/05/22  Next Progress Note Due: 10th visit or 30 days from eval     Evaluating Physical Therapist: Sheran Fava, PT, DPT  PT diagnosis/Reason for Referral: neck pain  Next Scheduled Physician Appointment: mid december  Allergies/Contraindications: n/a       Subjective: RTD outcomes: referring her to neurologist. Patient reports if she is on her "feet a lot" she feels weakness in upper back, shoulders and neck that causes her to have to sit and rest. Thus far she does not feel she has made gains with therapy.     Objective: Activities as noted below    Measured ROM: not assessed today      EXERCISE/ACTIVITY NAME REPETITIONS RESISTANCE COMPLETED THIS DOS   Manual Therapy:  min  - STM to cervical paraspinals, upper trap, lev scap, scalenes  - Cervical distraction g1-2 to test patient tolerance (no sustained hold)  - manual range of motion to assess response  - suboccipital release        n   Cat Cow X5 (poor control)  Increase reps  n   Seated thoracic rotation X5 each alternating  n   Median/ ulnar nerve glides X10 each   (Increase to 20)   n   Therex:       supine cervical retraction with chin tuck  x10 with 5 sec hold    y: HEP 12/21    supine scap retraction isometric  x10 with 5 sec hold    y: HEP 12/21                       ADL/patient ed:  min  - assessment and discussing regarding UE/ hand tremors, grip strength, key pinch, finger tip pinch strength  - discussed age related norms to grip strength  - discussed/ added nerve glides to HEP   - discussed potential necessity for manual and mechanical traction if it can be tolerated if introduced more slowly and attmepted to  help patient differentiate between muscle, joint, and nerve pain               Access Code: [email or web address]  URL: [email or web address]  Date: 03/02/2022  Prepared by: Benjie Karvonen    Exercises  - Supine Chin Tuck  - 2 x daily - 7 x weekly - 1 sets - 10 reps - 5 sec hold  Drawing on same handout directing her to perform supine scapular isometrics with same frequency as chin tucks.    Assessment: Per patient she does not feel she is making gains. Time was spent answering patient's questions about cervical MRI findings and if therapy would be able to address these findings. Patient has had difficulty tolerating certain aspects of treatment in past visit that were provided in a gentle manner. However, in response to her reporting she has to sit and rest after standing d/t feeling weakness in upper back and neck very gentle strengthening via cervical and scapular isometrics were initiated. These two exercises were added to HEP with instructions to not perform at home until tomorrow and then only if she is not having a negative  response to what was performed during session today. She required VC, TC and manual assist to perform chin tuck with cervical retraction correctly.     GOALS:  Short Term Goals: 4 weeks     - Patient will reduce max cervical pain by 2/10  - Patient will be independent with HEP      Long Term Goals: 8 weeks     - Improve cervical extension ROM to 45+ degrees to improve navigation of environment  - Improve cervical RSB ROM to 35+ degrees to improve cervical ROM   - Improve prone knee flexion to 125 deg + without exacerbation of LBP  - Eliminate (+) sciatic neural tension testing      Plan: Assess response to treatment (did the exercises help reduce symptoms of weakness with standing as stated above) this day and HEP.    Total Session Time 35 and Timed code minutes 35      Benjie Karvonen, PTA  03/02/2022 13:47      [place] ATTENDED 6 PT VISITS BETWEEN 02/08/22 AND 03/02/22 WITH DX OF NECK PAIN.  THERAPY INCLUDED PATIENT EDUCATION, MANUAL THERAPY, AND EXERCISES FOR MOBILITY AND STRENGTHENING. AS NOTED ABOVE, PATIENT DID NOT FEEL She WAS MAKING GAINS WITH THERAPY. She CANCELLED HER LAST 2 SCHEDULED THERAPY APPTS DUE TO ILLNESS AND DID NOT RESCHEDULE. AS OF 04/20/22 WE HAVE NOT HEARD BACK REGARDING CONTINUED THERAPY; THEREFORE, She IS D/C AT THIS TIME. SEE ABOVE FOR FINAL STATUS.   Garrel Ridgel, PT  04/20/2022 07:45

## 2022-03-03 ENCOUNTER — Telehealth (HOSPITAL_COMMUNITY): Payer: Self-pay

## 2022-03-03 NOTE — Telephone Encounter (Signed)
Per patient request: Therapist called [facility] at 03/03/2022 14:30 for more clarification on the term "subluxation" in imaging report. Per Radiologist: Subluxation in patient report indicates mild anterior translation. It is common and not of significant concern, but he cannot clarify whether or not it is mobile as they did not take flexion/extension views. He stated this is not in their common practice. Radiologist stated this did not present any contraindications for physical therapy. Therapist requested report be faxed to clinic, radiology office confirmed. Fax number provided.     Sheran Fava, PT

## 2022-03-09 ENCOUNTER — Ambulatory Visit (HOSPITAL_COMMUNITY): Payer: Self-pay

## 2022-03-14 ENCOUNTER — Ambulatory Visit (HOSPITAL_COMMUNITY): Payer: Self-pay

## 2022-07-17 ENCOUNTER — Encounter (INDEPENDENT_AMBULATORY_CARE_PROVIDER_SITE_OTHER): Payer: Self-pay | Admitting: OTOLARYNGOLOGY

## 2022-10-26 ENCOUNTER — Other Ambulatory Visit (HOSPITAL_COMMUNITY): Payer: Self-pay | Admitting: NURSE PRACTITIONER

## 2022-10-26 DIAGNOSIS — Z1231 Encounter for screening mammogram for malignant neoplasm of breast: Secondary | ICD-10-CM

## 2022-11-09 ENCOUNTER — Encounter (HOSPITAL_COMMUNITY): Payer: Self-pay

## 2022-11-09 ENCOUNTER — Other Ambulatory Visit: Payer: Self-pay

## 2022-11-09 ENCOUNTER — Inpatient Hospital Stay
Admission: RE | Admit: 2022-11-09 | Discharge: 2022-11-09 | Disposition: A | Discharge status: Home / Self Care | Payer: BC Managed Care – PPO | Source: Ambulatory Visit | Attending: NURSE PRACTITIONER

## 2022-11-09 DIAGNOSIS — Z1231 Encounter for screening mammogram for malignant neoplasm of breast: Secondary | ICD-10-CM | POA: Insufficient documentation

## 2022-12-14 ENCOUNTER — Ambulatory Visit (INDEPENDENT_AMBULATORY_CARE_PROVIDER_SITE_OTHER): Payer: BC Managed Care – PPO | Admitting: Surgery

## 2022-12-27 ENCOUNTER — Ambulatory Visit (INDEPENDENT_AMBULATORY_CARE_PROVIDER_SITE_OTHER): Payer: BC Managed Care – PPO | Admitting: Surgery

## 2022-12-27 ENCOUNTER — Other Ambulatory Visit: Payer: Self-pay

## 2022-12-27 ENCOUNTER — Encounter (INDEPENDENT_AMBULATORY_CARE_PROVIDER_SITE_OTHER): Payer: Self-pay | Admitting: Surgery

## 2022-12-27 VITALS — BP 163/67 | HR 75 | Temp 97.2°F | Wt 173.0 lb

## 2022-12-27 DIAGNOSIS — Z860100 Personal history of colon polyps, unspecified: Secondary | ICD-10-CM

## 2022-12-28 ENCOUNTER — Encounter (INDEPENDENT_AMBULATORY_CARE_PROVIDER_SITE_OTHER): Payer: Self-pay | Admitting: Surgery

## 2022-12-28 ENCOUNTER — Ambulatory Visit: Payer: BC Managed Care – PPO | Attending: SLEEP MEDICINE | Admitting: SLEEP MEDICINE

## 2022-12-28 ENCOUNTER — Encounter (HOSPITAL_COMMUNITY): Payer: Self-pay | Admitting: SLEEP MEDICINE

## 2022-12-28 VITALS — BP 181/81 | HR 79 | Temp 98.1°F | Resp 16 | Ht 68.0 in | Wt 172.0 lb

## 2022-12-28 DIAGNOSIS — R0600 Dyspnea, unspecified: Secondary | ICD-10-CM | POA: Insufficient documentation

## 2022-12-28 DIAGNOSIS — J449 Chronic obstructive pulmonary disease, unspecified: Secondary | ICD-10-CM | POA: Insufficient documentation

## 2022-12-28 DIAGNOSIS — Z87891 Personal history of nicotine dependence: Secondary | ICD-10-CM | POA: Insufficient documentation

## 2022-12-28 MED ORDER — BREZTRI AEROSPHERE 160 MCG-9MCG-4.8MCG/ACTUATION HFA AEROSOL INHALER
2.0000 | INHALATION_SPRAY | Freq: Two times a day (BID) | RESPIRATORY_TRACT | 1 refills | Status: AC
Start: 2022-12-28 — End: 2023-04-18

## 2022-12-28 MED ORDER — IPRATROPIUM 0.5 MG-ALBUTEROL 3 MG (2.5 MG BASE)/3 ML NEBULIZATION SOLN
3.0000 mL | INHALATION_SOLUTION | Freq: Four times a day (QID) | RESPIRATORY_TRACT | 2 refills | Status: AC
Start: 2022-12-28 — End: ?

## 2022-12-28 NOTE — Assessment & Plan Note (Signed)
Will repeat PFT in March 2025 before next appt.    Orders:    PULMONARY FUNCTION TESTING - ADULT; Future    CT LUNG CANCER SCREENING PROGRAM BASELINE; Future

## 2022-12-28 NOTE — H&P (Signed)
PULMONARY, [facility]  [address]  [place] [address]  Operated by [facility]     New Patient    Patient Name: Mckenzie Stewart  Date: 12/28/2022  Department:  PULMONARY, [facility]  MRN: [id number]  DOB: 11-08-1955  Primary Care Provider:  Candida Peeling, NP  Referring Provider:  Candida Peeling, NP      Chief Complaint:   Chief Complaint   Patient presents with    Well Check Adult     Pt presents for new patient wellness exam. History of COPD.          History of Present Illness:   Mckenzie Stewart is a 67 y.o., White female presents today to establish care and be followed for COPD.    Used to see Dr Verdell Face, but he left town and she needs new pulmonologist.    Most recent PFT done at Dr Rebeca Allegra office on 07/07/2021 showing Forced vital capacity of 89% of the predicted.  FEV1 51% of the predicted with post-bronchodilator at 53% of the predicted.  The FEV1/FVC 41%. The patient is not on oxygen.  She does complain of frequent allergy symptoms.  But reports that she is currently stable on her current inhalers of the Breztri 2 puffs twice a day and Albuterol PRN.  Patient does request refills on respiratory medications.    Previous CBC and IgE levels showed negative for eosinophilia.  Former smoker.  Had quit smoking in 2010.        Past Medical history  Past Medical History:   Diagnosis Date    COPD (chronic obstructive pulmonary disease) (CMS HCC)     Hypertension     Lumbar pain     Vitamin D deficiency      Past Surgical History  Past Surgical History:   Procedure Laterality Date    COLONOSCOPY      HX LIPOMA RESECTION      chin         Medication List  Current Outpatient Medications   Medication Sig    acetaminophen (TYLENOL) 500 mg Oral Tablet Take 2 Tablets (1,000 mg total) by mouth Every 6 hours as needed for Pain    acetylcysteine (NAC) 600 mg Oral Capsule Take 1 Capsule (600 mg total) by mouth Once a day    ascorbic acid (VITAMIN C)  1,000 mg Oral Tablet Take 1 Tablet (1,000 mg total) by mouth Once a day    BREZTRI AEROSPHERE 160-9-4.8 mcg/actuation Inhalation HFA Aerosol Inhaler Take 2 Puffs by inhalation Twice daily for 90 days    calcium citrate-vitamin D3 (CITRACAL) 200 mg-6.25 mcg (250 unit) Oral Tablet Take by mouth Once a day    chlorpheniramine maleate (CHLORPHEN SR ORAL) Take by mouth    cholecalciferol, vitamin D3, 250 mcg (10,000 unit) Oral Capsule Take 1 Capsule (10,000 Units total) by mouth Once a day    EPINEPHrine 0.3 mg/0.3 mL Injection Auto-Injector Inject 0.3 mL (0.3 mg total) into the muscle Once, as needed for up to 1 dose    guaifenesin (MUCINEX) 1,200 mg Oral Tablet Extended Release 12hr Take 1 Tablet (1,200 mg total) by mouth Every morning    guaiFENesin 100 mg/5 mL Oral Liquid Take 5 mL (100 mg total) by mouth Every night    Ibuprofen (MOTRIN) 200 mg Oral Tablet Take 1 Tablet (200 mg total) by mouth Four times a day as needed for Pain    ipratropium-albuterol 0.5 mg-3 mg(2.5 mg base)/3 mL Solution for  Nebulization Take 3 mL by nebulization Four times a day Indications: bronchi muscle spasm resulting from COPD    losartan (COZAAR) 50 mg Oral Tablet Take 1.5 Tablets (75 mg total) by mouth Once a day     Allergy List  Allergy History as of 12/28/22        No Known Allergies                  Family History   Family Medical History:       Problem Relation (Age of Onset)    Breast Cancer Maternal Aunt    No Known Problems Mother, Father, Sister, Brother, Maternal Grandmother, Maternal Grandfather, Paternal Grandmother, Paternal Grandfather, Daughter, Son, Maternal Uncle, Paternal Aunt, Paternal Uncle, Other            Social History  Social History     Socioeconomic History    Marital status: Married   Tobacco Use    Smoking status: Former     Current packs/day: 0.00     Average packs/day: 1 pack/day for 42.0 years (42.0 ttl pk-yrs)     Types: Cigarettes     Start date: 1968     Quit date: 2010     Years since quitting: 14.8     Smokeless tobacco: Never   Vaping Use    Vaping status: Never Used   Substance and Sexual Activity    Alcohol use: Never    Drug use: Never        Review of system  General:  Denies fever, chills, night sweats, loss of appetite.  Neurological:  Denies dizziness or seizures.  Gastrointestinal:  Denies uncontrolled reflux, heartburn, or diarrhea.  Cardiovascular:  Denies chest pain or irregular heartbeats.  Respiratory: see HPI  Musculoskeletal:  Denies uncontrolled joint pain or restless legs.  Endocrine/Metabolic:  Denies weight gain or weight loss.  Mental Status/Psychiatric:  Denies uncontrolled anxiety or depression.  Integumentary:  Denies rash or new abnormal skin lesions.    Vital Signs  Vitals:    12/28/22 1417   BP: (!) 181/81   Pulse: 79   Resp: 16   Temp: 36.7 C (98.1 F)   TempSrc: Oral   SpO2: 98%   Weight: 78 kg (172 lb)   Height: 1.727 m (5\' 8" )   BMI: 26.21              PHYSICAL EXAMINATION:   Constitutional:  Vital signs stable.  General appearance of the patient:  Alert, no acute distress.  Normal appearance, well nourished.  Eyes:  Normal eye lids.  Conjunctiva normal.  Ears, Nose, Mouth, and Throat: External inspection of ears and nose with normal appearance.  Inspection of lips, teeth and gums with normal appearance and oral mucosa normal.  Nares patent.  Neck: Supple with trachea midline, non tender, no nodules, no masses, gland position midline.  Respiratory:  Auscultation of lungs with distant breath sounds, but no rales, no rhonchi, no wheezing.  Respiratory effort with no tractions, breathing regular and unlabored.  Cardiovascular:  Regular rhythm and regular rate.  No murmur, no peripheral edema.  Gastrointestinal: Abdomen non-tender.  Musculoskeletal:  Normal gait and station, normal digits, no digital cyanosis or clubbing.  Mental Status/Psychiatric:  Alert- oriented to person, place, and time.  Appropriate and normal mood.    Assessment & Plan  Chronic obstructive pulmonary disease,  unspecified COPD type (CMS HCC)  Will repeat PFT in March 2025 before next appt.    Orders:  PULMONARY FUNCTION TESTING - ADULT; Future    CT LUNG CANCER SCREENING PROGRAM BASELINE; Future    Ex-smoker  42 pack history. LDCT to be done.  Orders:    CT LUNG CANCER SCREENING PROGRAM BASELINE; Future    Dyspnea, unspecified type  Occasional and with infection              Instructions  Continue medications as prescribed/directed unless changed by provider.    Refills sent to pharmacy for [facility] and [facility].  Will call office if needing refill on Albuterol HFA.      Plan of care discussed with patient.    Discussed vaccinations need.  Patient scheduled to have Influenza and RSV vaccine next week at pharmacy.  Encouraged Prevnar 20 vaccine as well.      Order LDCT for screening due to previous smoking history.      PFT to be done before next appt.      Return in about 6 months (around 06/28/2023) for LDCT for screening, PFT before next visit. Patient was advised to come back earlier if any symptoms get worse.  Also advised to come back for results of any test done.  If unable to keep regular appointment, patient was advised to schedule another appointment as soon as possible.      The patient was given the opportunity to ask questions and those questions were answered to the patient's satisfaction. The patient was encouraged to call with any additional questions or concerns. Discussed with the patient effects and side effects of medications. Medication safety was discussed.  The patient was informed to contact the office within 7 business days if a message/lab results/referral/imaging results have not been conveyed to the patient.    Electronically signed by Larence Penning, FNP-BC   Pulmonary and Critical care    This note may have been partially generated using MModal Fluency Direct system, and there may be some incorrect words, spellings, and punctuation that were not noted in checking the note before saving.

## 2022-12-28 NOTE — H&P (Signed)
Office History and Physical      Reason for Visit: Colonoscopy    History of Present Illness  Mckenzie Stewart presents as a referral by Candida Peeling for evaluation of colonoscopy with history of polypectomy about 5 years ago.  Study then revealed internal hemorrhoids with sigmoid diverticulosis and a miniscule polyp.  Currently asymptomatic.  Denies constipation, diarrhea, change in bowel habits, rectal bleeding, weight loss, blood in stools, change in stool caliber, or mucous in stools.  No abdominal pain.  No family history of colon cancer.  No melena, hematemesis, or dysphagia        I have reviewed the patient's provided medical records and diagnostic testing including laboratory values, imaging results, documented encounters and providers notes with all pertinent information noted with respect to today's evaluation serving as unique tests and sources as a component of the medical decision making process for this encounter relevant to the patients independent evaluation by me today.        Patient Data  Patient History  Past Medical History:   Diagnosis Date    COPD (chronic obstructive pulmonary disease) (CMS HCC)     Hypertension     Lumbar pain     Vitamin D deficiency          Past Surgical History:   Procedure Laterality Date    COLONOSCOPY      HX LIPOMA RESECTION      chin         Current Outpatient Medications   Medication Sig    acetaminophen (TYLENOL) 500 mg Oral Tablet Take 2 Tablets (1,000 mg total) by mouth Every 6 hours as needed for Pain    acetylcysteine (NAC) 600 mg Oral Capsule Take 1 Capsule (600 mg total) by mouth Once a day    ascorbic acid (VITAMIN C) 1,000 mg Oral Tablet Take 1 Tablet (1,000 mg total) by mouth Once a day    BREZTRI AEROSPHERE 160-9-4.8 mcg/actuation Inhalation HFA Aerosol Inhaler Take 2 Puffs by inhalation Twice daily for 90 days    calcium citrate-vitamin D3 (CITRACAL) 200 mg-6.25 mcg (250 unit) Oral Tablet Take by mouth Once a day    chlorpheniramine maleate  (CHLORPHEN SR ORAL) Take by mouth    cholecalciferol, vitamin D3, 250 mcg (10,000 unit) Oral Capsule Take 1 Capsule (10,000 Units total) by mouth Once a day    EPINEPHrine 0.3 mg/0.3 mL Injection Auto-Injector Inject 0.3 mL (0.3 mg total) into the muscle Once, as needed for up to 1 dose    guaifenesin (MUCINEX) 1,200 mg Oral Tablet Extended Release 12hr Take 1 Tablet (1,200 mg total) by mouth Every morning    guaiFENesin 100 mg/5 mL Oral Liquid Take 5 mL (100 mg total) by mouth Every night    Ibuprofen (MOTRIN) 200 mg Oral Tablet Take 1 Tablet (200 mg total) by mouth Four times a day as needed for Pain    ipratropium-albuterol 0.5 mg-3 mg(2.5 mg base)/3 mL Solution for Nebulization Take 3 mL by nebulization Four times a day Indications: bronchi muscle spasm resulting from COPD    losartan (COZAAR) 50 mg Oral Tablet Take 1.5 Tablets (75 mg total) by mouth Once a day     No Known Allergies  Family Medical History:       Problem Relation (Age of Onset)    Breast Cancer Maternal Aunt    No Known Problems Mother, Father, Sister, Brother, Maternal Grandmother, Maternal Grandfather, Paternal Grandmother, Paternal Grandfather, Daughter, Son, Maternal Uncle, Paternal Aunt, Paternal Uncle, Other  Social History     Tobacco Use    Smoking status: Former     Current packs/day: 0.00     Average packs/day: 1 pack/day for 42.0 years (42.0 ttl pk-yrs)     Types: Cigarettes     Start date: 42     Quit date: 2010     Years since quitting: 14.8    Smokeless tobacco: Never   Vaping Use    Vaping status: Never Used   Substance Use Topics    Alcohol use: Never    Drug use: Never        The above documented section regarding past medical, past surgical, family, and social history (PMFSH) has been reviewed and considered and to the best of my knowledge represents a valid and accurate reflection of the patient's previous pertinent experiences documented by multiple providers and participants of the EMR.I cannot attest to all  entries but do no recognize any gross inaccuracies as the data is a common field across all providers  Further history pertinent to the current encounter will be found as referenced       Physical Examination:    Vitals:    12/27/22 1313   BP: (!) 163/67   Pulse: 75   Temp: 36.2 C (97.2 F)   SpO2: 94%   Weight: 78.5 kg (173 lb)          General:appropriate for age. in no acute distress.  HEENT:Atraumatic, Normocephalic.   Lungs:Nonlabored breathing with symmetric expansion  Heart:Regular wth respect to rate and rythmn.    Abdomen:Soft. Nontender. Nondistended  Bowel sounds are present. No peritoneal signs  Skin:No Rashes. No ulcers. Skin warm  Psychiatric:Alert and oriented to person, place, and time. affect appropriate        Diagnosis:    ICD-10-CM    1. History of colon polyps  Z86.0100           Plan:    Discussed indications, risks and benefits of colonoscopy with the patient.  Discussed the possibility of polypectomy, biopsies, and possible repeat examinations.  Risks include bleeding, sedation risks, possibility of missed diagnosis of polyp or malignancy, and remote possibilities of perforation and death.  All questions were answered and informed consent was clearly obtained.  Jehnna would like to wait a year or so for repeat exam which is probably acceptable given her lack of symptoms.  Needs to consider repeat exam within the next couple of years which would be between 5-7 years from prior exam.      This note may have been partially generated using MModal Fluency Direct system, and there may be some incorrect words, spellings, and punctuation that were not noted in checking the note before saving, though effort was made to avoid such errors.    Clide Dales MD FACS RVT  [facility] -General Surgery

## 2022-12-28 NOTE — Assessment & Plan Note (Signed)
Occasional and with infection

## 2022-12-28 NOTE — Assessment & Plan Note (Signed)
42 pack history. LDCT to be done.  Orders:    CT LUNG CANCER SCREENING PROGRAM BASELINE; Future

## 2022-12-29 ENCOUNTER — Encounter (HOSPITAL_COMMUNITY): Payer: Self-pay

## 2023-01-03 ENCOUNTER — Other Ambulatory Visit: Payer: Self-pay

## 2023-01-03 ENCOUNTER — Other Ambulatory Visit: Payer: BC Managed Care – PPO | Attending: PSYCHIATRY AND NEUROLOGY-NEUROLOGY

## 2023-01-03 DIAGNOSIS — R451 Restlessness and agitation: Secondary | ICD-10-CM | POA: Insufficient documentation

## 2023-01-03 DIAGNOSIS — M1990 Unspecified osteoarthritis, unspecified site: Secondary | ICD-10-CM | POA: Insufficient documentation

## 2023-01-03 LAB — HGA1C (HEMOGLOBIN A1C WITH EST AVG GLUCOSE): HEMOGLOBIN A1C: 5.7 % (ref 4.0–6.0)

## 2023-01-05 LAB — KAPPA AND LAMBDA FREE LIGHT CHAINS, SERUM
KAPPA FREE LIGHT CHAINS: 0.98 mg/dL — ABNORMAL LOW (ref 1.25–3.25)
KAPPA/LAMBDA FLC RATIO: 0.69 — ABNORMAL LOW (ref 0.80–2.10)
LAMBDA FREE LIGHT CHAINS: 1.43 mg/dL (ref 0.60–2.70)

## 2023-01-05 LAB — MONOCLONAL GAMMOPATHY PROFILE WITH IMMUNOTYPING REFLEX
ALBUMIN: 4.1 g/dL (ref 3.4–4.8)
KAPPA FREE LIGHT CHAINS: 0.98 mg/dL — ABNORMAL LOW (ref 1.25–3.25)
KAPPA/LAMBDA FLC RATIO: 0.69 — ABNORMAL LOW (ref 0.80–2.10)
LAMBDA FREE LIGHT CHAINS: 1.43 mg/dL (ref 0.60–2.70)
TOTAL PROTEIN: 7 g/dL

## 2023-01-05 LAB — PROTEIN FOR ELECTROPHORESIS: PROTEIN TOTAL: 7 g/dL (ref 5.6–7.6)

## 2023-01-05 LAB — LYME ANTIBODY PANEL WITH REFLEX: LYME ANTIBODY TOTAL (Screen): NEGATIVE

## 2023-01-05 LAB — ALBUMIN FOR ELECTROPHORESIS: ALBUMIN: 4.1 g/dL (ref 3.4–4.8)

## 2023-01-15 ENCOUNTER — Encounter (INDEPENDENT_AMBULATORY_CARE_PROVIDER_SITE_OTHER): Payer: Self-pay | Admitting: Surgery

## 2023-01-17 ENCOUNTER — Ambulatory Visit (INDEPENDENT_AMBULATORY_CARE_PROVIDER_SITE_OTHER): Payer: Self-pay | Admitting: Surgery

## 2023-01-30 ENCOUNTER — Other Ambulatory Visit (HOSPITAL_COMMUNITY): Payer: Self-pay | Admitting: Orthopaedic Surgery

## 2023-01-30 DIAGNOSIS — M4602 Spinal enthesopathy, cervical region: Secondary | ICD-10-CM

## 2023-02-26 ENCOUNTER — Other Ambulatory Visit (HOSPITAL_COMMUNITY): Payer: BC Managed Care – PPO

## 2023-04-18 ENCOUNTER — Ambulatory Visit (INDEPENDENT_AMBULATORY_CARE_PROVIDER_SITE_OTHER): Payer: BC Managed Care – PPO | Admitting: NEUROLOGY

## 2023-04-18 ENCOUNTER — Encounter (INDEPENDENT_AMBULATORY_CARE_PROVIDER_SITE_OTHER): Payer: Self-pay | Admitting: NEUROLOGY

## 2023-04-18 ENCOUNTER — Other Ambulatory Visit: Payer: Self-pay

## 2023-04-18 ENCOUNTER — Ambulatory Visit: Payer: BC Managed Care – PPO | Attending: NEUROLOGY

## 2023-04-18 VITALS — BP 178/74 | HR 79 | Temp 97.9°F | Ht 68.0 in | Wt 173.6 lb

## 2023-04-18 DIAGNOSIS — M064 Inflammatory polyarthropathy: Secondary | ICD-10-CM

## 2023-04-18 DIAGNOSIS — G4486 Cervicogenic headache: Secondary | ICD-10-CM

## 2023-04-18 DIAGNOSIS — J449 Chronic obstructive pulmonary disease, unspecified: Secondary | ICD-10-CM

## 2023-04-18 DIAGNOSIS — G319 Degenerative disease of nervous system, unspecified: Secondary | ICD-10-CM

## 2023-04-18 DIAGNOSIS — G629 Polyneuropathy, unspecified: Secondary | ICD-10-CM

## 2023-04-18 LAB — COMPREHENSIVE METABOLIC PANEL, NON-FASTING
ALBUMIN/GLOBULIN RATIO: 1.5 — ABNORMAL HIGH (ref 0.8–1.4)
ALBUMIN: 4.6 g/dL (ref 3.5–5.7)
ALKALINE PHOSPHATASE: 64 U/L (ref 34–104)
ALT (SGPT): 12 U/L (ref 7–52)
ANION GAP: 13 mmol/L (ref 4–13)
AST (SGOT): 16 U/L (ref 13–39)
BILIRUBIN TOTAL: 0.4 mg/dL (ref 0.3–1.0)
BUN/CREA RATIO: 22 (ref 6–22)
BUN: 17 mg/dL (ref 7–25)
CALCIUM, CORRECTED: 10 mg/dL (ref 8.9–10.8)
CALCIUM: 10.5 mg/dL — ABNORMAL HIGH (ref 8.6–10.3)
CHLORIDE: 98 mmol/L (ref 98–107)
CO2 TOTAL: 24 mmol/L (ref 21–31)
CREATININE: 0.79 mg/dL (ref 0.60–1.30)
ESTIMATED GFR: 81 mL/min/{1.73_m2} (ref >59)
GLOBULIN: 3 (ref 2.0–3.5)
GLUCOSE: 94 mg/dL (ref 74–109)
OSMOLALITY, CALCULATED: 271 mosm/kg (ref 270–290)
POTASSIUM: 3.9 mmol/L (ref 3.5–5.1)
PROTEIN TOTAL: 7.6 g/dL (ref 6.4–8.9)
SODIUM: 135 mmol/L — ABNORMAL LOW (ref 136–145)

## 2023-04-18 LAB — IRON TRANSFERRIN AND TIBC
IRON (TRANSFERRIN) SATURATION: 10 % — ABNORMAL LOW (ref 15–50)
IRON: 40 ug/dL — ABNORMAL LOW (ref 50–212)
TOTAL IRON BINDING CAPACITY: 391 ug/dL (ref 250–450)
TRANSFERRIN: 279 mg/dL (ref 203–362)
UIBC: 351 ug/dL (ref 130–375)

## 2023-04-18 LAB — VITAMIN D 25 TOTAL: VITAMIN D 25, TOTAL: 50.98 ng/mL (ref 30.00–100.00)

## 2023-04-18 LAB — FOLATE: FOLATE: 33.2 ng/mL — ABNORMAL HIGH (ref 5.9–24.8)

## 2023-04-18 LAB — HGA1C (HEMOGLOBIN A1C WITH EST AVG GLUCOSE): HEMOGLOBIN A1C: 6 % (ref 4.0–6.0)

## 2023-04-18 LAB — VITAMIN B12: VITAMIN B 12: 1262 pg/mL — ABNORMAL HIGH (ref 180–914)

## 2023-04-18 LAB — FERRITIN: FERRITIN: 65 ng/mL (ref 11–307)

## 2023-04-18 MED ORDER — OMEPRAZOLE 40 MG CAPSULE,DELAYED RELEASE
40.0000 mg | DELAYED_RELEASE_CAPSULE | Freq: Every day | ORAL | 4 refills | Status: DC
Start: 2023-04-18 — End: 2023-06-12

## 2023-04-18 MED ORDER — MELOXICAM 15 MG TABLET
15.0000 mg | ORAL_TABLET | Freq: Every day | ORAL | 2 refills | Status: DC
Start: 2023-04-18 — End: 2023-06-12

## 2023-04-18 MED ORDER — GABAPENTIN 100 MG CAPSULE
100.0000 mg | ORAL_CAPSULE | Freq: Three times a day (TID) | ORAL | 1 refills | Status: DC
Start: 2023-04-18 — End: 2023-06-12

## 2023-04-18 NOTE — Progress Notes (Unsigned)
NEUROLOGY, [facility]  [address]  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 She has previously undergone MRI imaging, physical therapy, and trigger point injections, which provided temporary relief. She is considering medications such as gabapentin and Lyrica but has concerns about long-term use.  Schedule for trigger point injection April 30  Meloxicam 15 mg daily; wean off Ibuprofen over 1 week  Stomach protection with omeprazole 40 mg for 30 days  RTC 2 months  Neuropathy (CMS HCC): unclear etiology.   Labs: VITAMIN B12, FOLATE, VITAMIN D 25 TOTAL, COPPER, VITAMIN B6, HGA1C, FERRITIN, IRON TRANSFERRIN AND TIBC   Gabapentin 100 mg TID    Thank you for allowing me to participate in your patient's care and please do not hesitate to contact me for any questions or concerns.    On the day of the encounter, a total of 45 minutes was spent on this patient encounter including review of historical information, examination, documentation and post-visit activities. The time documented excludes procedural time.    Cecile Sheerer, MD  Assistant Professor of Neurology  [facility]    =====================================================================    NAME:  Mckenzie Stewart  DOB:  [birth year]  VISIT DATE:  04/18/2023     CC:  Headache    Patient seen in consultation at the request of Candida Peeling, NP   History obtained from the patient and chart/records  Age of patient:  68 y.o.    I had the pleasure of seeing Mckenzie Stewart for outpatient consultation, who is a 68 y.o. year old female and is being seen for management of above CC.    HPI:    68 year old female presents with headache. Patient has undergone diagnostic imaging with MRIs, but results are currently unavailable. She has participated in physical therapy as part of her treatment plan. Additionally, the patient has received trigger  point injections in the lower back and shoulder areas from Dr. Delton See in [place], an orthopedist, which provided symptom relief for approximately 10 days. The patient is actively exploring various treatment options, focusing on non-surgical interventions. She is considering the use of medications such as gabapentin and Lyrica but expresses concerns regarding long-term medication use.     =====================================================================  PMHx  Patient Active Problem List   Diagnosis    Chronic obstructive pulmonary disease (CMS HCC)    Ex-smoker    Dyspnea     Past Surgical History:   Procedure Laterality Date    COLONOSCOPY      HX LIPOMA RESECTION      chin         Family Medical History:       Problem Relation (Age of Onset)    Breast Cancer Maternal Aunt    No Known Problems Mother, Father, Sister, Brother, Maternal Grandmother, Maternal Grandfather, Paternal Grandmother, Paternal Grandfather, Daughter, Son, Maternal Uncle, Paternal Aunt, Paternal Uncle, Other            Current Outpatient Medications   Medication Sig Dispense Refill    acetaminophen (TYLENOL) 500 mg Oral Tablet Take 2 Tablets (1,000 mg total) by mouth Every 6 hours as needed for Pain      acetylcysteine (NAC) 600 mg Oral Capsule Take 1 Capsule (600 mg total) by mouth Once a day      ascorbic acid (VITAMIN C) 1,000 mg Oral Tablet Take 1 Tablet (1,000 mg total) by mouth Once a day  BREZTRI AEROSPHERE 160-9-4.8 mcg/actuation Inhalation HFA Aerosol Inhaler Take 2 Puffs by inhalation Twice daily for 90 days 3 Each 1    calcium citrate-vitamin D3 (CITRACAL) 200 mg-6.25 mcg (250 unit) Oral Tablet Take by mouth Once a day      chlorpheniramine maleate (CHLORPHEN SR ORAL) Take by mouth      cholecalciferol, vitamin D3, 250 mcg (10,000 unit) Oral Capsule Take 1 Capsule (10,000 Units total) by mouth Once a day      EPINEPHrine 0.3 mg/0.3 mL Injection Auto-Injector Inject 0.3 mL (0.3 mg total) into the muscle Once, as needed for up  to 1 dose 2 Each 0    guaifenesin (MUCINEX) 1,200 mg Oral Tablet Extended Release 12hr Take 1 Tablet (1,200 mg total) by mouth Every morning      guaiFENesin 100 mg/5 mL Oral Liquid Take 5 mL (100 mg total) by mouth Every night      Ibuprofen (MOTRIN) 200 mg Oral Tablet Take 1 Tablet (200 mg total) by mouth Four times a day as needed for Pain      ipratropium-albuterol 0.5 mg-3 mg(2.5 mg base)/3 mL Solution for Nebulization Take 3 mL by nebulization Four times a day Indications: bronchi muscle spasm resulting from COPD 120 Each 2    losartan (COZAAR) 50 mg Oral Tablet Take 1.5 Tablets (75 mg total) by mouth Once a day       No current facility-administered medications for this visit.     No Known Allergies  Social History     Socioeconomic History    Marital status: Married     Spouse name: Not on file    Number of children: Not on file    Years of education: Not on file    Highest education level: Not on file   Occupational History    Not on file   Tobacco Use    Smoking status: Former     Current packs/day: 0.00     Average packs/day: 1 pack/day for 42.0 years (42.0 ttl pk-yrs)     Types: Cigarettes     Start date: 39     Quit date: 2010     Years since quitting: 15.1    Smokeless tobacco: Never   Vaping Use    Vaping status: Never Used   Substance and Sexual Activity    Alcohol use: Never    Drug use: Never    Sexual activity: Not on file   Other Topics Concern    Not on file   Social History Narrative    Not on file     Social Determinants of Health     Financial Resource Strain: Not on file   Transportation Needs: Not on file   Social Connections: Not on file   Intimate Partner Violence: Not on file   Housing Stability: Not on file       =====================================================================  GENERAL EXAMINATION  BP (!) 178/74 (Site: Left Arm, Patient Position: Sitting)   Pulse 79   Temp 36.6 C (97.9 F) (Temporal)   Ht 1.727 m (5\' 8" )   Wt 78.7 kg (173 lb 9.6 oz)   SpO2 96%   BMI 26.40  kg/m       Vital signs personally reviewed    General: No acute distress, alert  HEENT: Normocephalic, no scleral icterus  Pulmonary: No accessory muscle use, no tachypnea  Cardiovascular: Heart with regular rate & rhythm  Extremities: No significant edema, No cyanosis    NEUROLOGIC EXAM  MENTAL STATUS:  alert and oriented to person, place, time, and situation; speech clear and fluent with good repetition and comprehension; naming intact; attention/concentration normal; recent and remote memory intact with normal fund of knowledge; able to follow simple and complex axial and appendicular commands without L/R confusion.    CN  II: not directly tested, grossly intact  III, IV, VI: extraocular movements intact without nystagmus  V: intact to light touch  VII: face symmetric without weakness  VIII: grossly intact  IX, X: symmetric palatal elevation  XI: normal strength of trapezius and sternocleidomastoid bilaterally  XII: tongue midline with full movements    MOTOR  Bulk: normal  Tone: normal  Abnormal Movements: none  Fasciculations: none    Strength: Patient moving all extremities symmetrically and against gravity with good strength.    Reflexes: Diminished    Sensory:  Decreased in length-dependent fashion of bilateral lower extremities    Coordination:  No dysmetria    Gait:  Normal    =====================================================================  DATA  Personal review of prior labs is notable for:   HEMOGLOBIN A1C   Date Value Ref Range Status   01/03/2023 5.7 4.0 - 6.0 % Final     Personal review of imaging (with independent interpretation) is notable for: not available for review   Personal Review of other prior diagnostics is notable for: not available for review   =====================================================================    Orders  Orders Placed This Encounter    COMPREHENSIVE METABOLIC PANEL, NON-FASTING    VITAMIN B12    FOLATE    VITAMIN D 25 TOTAL    COPPER, SERUM    VITAMIN B6  (PYRIDOXAL 5-PHOSPHATE), PLASMA    HGA1C (HEMOGLOBIN A1C WITH EST AVG GLUCOSE)    FERRITIN    IRON TRANSFERRIN AND TIBC    meloxicam (MOBIC) 15 mg Oral Tablet    omeprazole (PRILOSEC) 40 mg Oral Capsule, Delayed Release(E.C.)    gabapentin (NEURONTIN) 100 mg Oral Capsule

## 2023-04-20 ENCOUNTER — Emergency Department
Admission: EM | Admit: 2023-04-20 | Discharge: 2023-04-20 | Disposition: A | Discharge status: Home / Self Care | Payer: BC Managed Care – PPO | Attending: NURSE PRACTITIONER | Admitting: NURSE PRACTITIONER

## 2023-04-20 ENCOUNTER — Other Ambulatory Visit: Payer: Self-pay

## 2023-04-20 DIAGNOSIS — I444 Left anterior fascicular block: Secondary | ICD-10-CM | POA: Insufficient documentation

## 2023-04-20 DIAGNOSIS — R9431 Abnormal electrocardiogram [ECG] [EKG]: Secondary | ICD-10-CM | POA: Insufficient documentation

## 2023-04-20 DIAGNOSIS — Z87891 Personal history of nicotine dependence: Secondary | ICD-10-CM | POA: Insufficient documentation

## 2023-04-20 DIAGNOSIS — I10 Essential (primary) hypertension: Secondary | ICD-10-CM | POA: Insufficient documentation

## 2023-04-20 DIAGNOSIS — M549 Dorsalgia, unspecified: Secondary | ICD-10-CM | POA: Insufficient documentation

## 2023-04-20 NOTE — Discharge Instructions (Addendum)
Please follow-up with your primary care

## 2023-04-20 NOTE — ED Nurses Note (Signed)
Pt evaluated and treated by medical provider while in waiting room area. No primary RN assigned; therefore, no assessment performed. All paperwork given and questions answered. Pt acknowledged all understanding. Leaving waiting area via ambulation.

## 2023-04-20 NOTE — ED Provider Notes (Signed)
Emergency Department  [facility]   04/20/2023     Mckenzie Stewart  10-18-1955  68 y.o.  female  [place] [id number]   [phone] (home)  PCP: Candida Peeling, NP   [phone]    Date of service:04/20/2023 21:19    Chief Complaint:   Chief Complaint   Patient presents with    Hypertension       Arrival: The patient arrived by Car and is {Accompanied by:[id number]}    HPI: This is a 68 y.o. female who presents to the emergency department ***    ASA***          [place] Pain Rating Scale     On a scale of 0-10, during the past 24 hours, pain has interfered with you usual activity:       On a scale of 0-10, during the past 24 hours, pain has interfered with your sleep:      On a scale of 0-10, during the past 24 hours, pain has affected your mood:       On a scale of 0-10, during the past 24 hours, pain has contributed to your stress:       On a scale of 0-10, what is your overall pain Rating: 0        Past Medical History:   Past Medical History:   Diagnosis Date    COPD (chronic obstructive pulmonary disease) (CMS HCC)     Hypertension     Lumbar pain     Vitamin D deficiency        Past Surgical History:   Past Surgical History:   Procedure Laterality Date    Colonoscopy      Hx lipoma resection         Social History:   Social History     Tobacco Use    Smoking status: Former     Current packs/day: 0.00     Average packs/day: 1 pack/day for 42.0 years (42.0 ttl pk-yrs)     Types: Cigarettes     Start date: 8     Quit date: 2010     Years since quitting: 15.1    Smokeless tobacco: Never   Vaping Use    Vaping status: Never Used   Substance Use Topics    Alcohol use: Never    Drug use: Never        Family History:  Family History   Problem Relation Age of Onset    No Known Problems Mother     No Known Problems Father     No Known Problems Sister     No Known Problems Brother     No Known Problems Maternal Grandmother     No Known Problems Maternal Grandfather     No Known Problems Paternal  Grandmother     No Known Problems Paternal Grandfather     No Known Problems Daughter     No Known Problems Son     Breast Cancer Maternal Aunt     No Known Problems Maternal Uncle     No Known Problems Paternal Aunt     No Known Problems Paternal Uncle     No Known Problems Other            Medications Prior to Admission       Prescriptions    acetaminophen (TYLENOL) 500 mg Oral Tablet    Take 2 Tablets (1,000 mg total) by mouth Every 6 hours as needed for  Pain    acetylcysteine (NAC) 600 mg Oral Capsule    Take 1 Capsule (600 mg total) by mouth Once a day    ascorbic acid (VITAMIN C) 1,000 mg Oral Tablet    Take 1 Tablet (1,000 mg total) by mouth Once a day    BREZTRI AEROSPHERE 160-9-4.8 mcg/actuation Inhalation HFA Aerosol Inhaler    Take 2 Puffs by inhalation Twice daily for 90 days    calcium citrate-vitamin D3 (CITRACAL) 200 mg-6.25 mcg (250 unit) Oral Tablet    Take by mouth Once a day    chlorpheniramine maleate (CHLORPHEN SR ORAL)    Take by mouth    cholecalciferol, vitamin D3, 250 mcg (10,000 unit) Oral Capsule    Take 1 Capsule (10,000 Units total) by mouth Once a day    EPINEPHrine 0.3 mg/0.3 mL Injection Auto-Injector    Inject 0.3 mL (0.3 mg total) into the muscle Once, as needed for up to 1 dose    gabapentin (NEURONTIN) 100 mg Oral Capsule    Take 1 Capsule (100 mg total) by mouth Three times a day    guaifenesin (MUCINEX) 1,200 mg Oral Tablet Extended Release 12hr    Take 1 Tablet (1,200 mg total) by mouth Every morning    guaiFENesin 100 mg/5 mL Oral Liquid    Take 5 mL (100 mg total) by mouth Every night    Ibuprofen (MOTRIN) 200 mg Oral Tablet    Take 1 Tablet (200 mg total) by mouth Four times a day as needed for Pain    ipratropium-albuterol 0.5 mg-3 mg(2.5 mg base)/3 mL Solution for Nebulization    Take 3 mL by nebulization Four times a day Indications: 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  Allergies, medication list, reviewed.        Physical exam:  Body mass index is 26.3 kg/m.  ED Triage Vitals [04/20/23 1733]   BP (Non-Invasive) (!) 197/86   Heart Rate 90   Respiratory Rate 16   Temperature 36.2 C (97.1 F)   SpO2 97 %   Weight 78.5 kg (173 lb)   Height 1.727 m (5\' 8" )     Constitutional: patient is oriented to person, place, and time and well-developed, well-nourished, and in no distress.   HENT:   Head: Normocephalic and atraumatic.   Right Ear: External ear normal.   Left Ear: External ear normal.   Nose: Nose normal.   Mouth/Throat: Oropharynx is clear and moist.   Eyes: Pupils are equal, round, and reactive to light. Conjunctivae and EOM are normal.   Neck: Normal range of motion. Neck supple.   Cardiovascular: Normal rate, regular rhythm, normal heart sounds and intact distal pulses.   Pulmonary/Chest: Effort normal and breath sounds normal.   Abdominal: Soft. Bowel sounds are normal.   Musculoskeletal: Normal range of motion.   Neurological:Patient is alert and oriented to person, place, and time.  Gait normal. GCS score is 15.  Skin: Skin is warm and dry.   Psychiatric: Mood, memory, affect and judgment normal.      The following orders were placed after examining the patient :  Orders Placed This Encounter    ECG 12 LEAD      No orders to display      No results found for this or any previous visit (from the past 12 hour(s)).      ED Course:             Emergency Department Procedure:  Procedures                         MDM     Findings and diagnosis discussed with patient.  Clinical Impression:   Clinical Impression   None         Disposition: Data Unavailable          No follow-ups on file.   New  Prescriptions    No medications on file      No follow-up provider specified.    Future Appointments  Future Appointments   Date Time Provider Department Center   05/16/2023  9:00 AM PRN CT MOBILE 1 PRNCT PRN   05/21/2023  8:00 AM Pft Tech Pulm Function, Prn PRNPFL PRN   06/12/2023  9:45 AM Cecile Sheerer, MD [facility] [address]   06/28/2023  2:00 PM Larence Penning, FNP-BC [facility] PRN   07/11/2023  1:00 PM Cecile Sheerer, MD [facility] [address]        BP (!) 197/86   Pulse 90   Temp 36.2 C (97.1 F)   Resp 16   Ht 1.727 m (5\' 8" )   Wt 78.5 kg (173 lb)   SpO2 97%   BMI 26.30 kg/m        Bonney Leitz, FNP2/09/2023              This note was partially created using voice recognition software and is inherently subject to errors including those of syntax and "sound alike " substitutions which may escape proof reading.  In such instances, original meaning may be extrapolated by contextual derivation.

## 2023-04-20 NOTE — ED Triage Notes (Signed)
C/O ELEVATED BP FOR APPROX 2 DAYS. RECENT MED CHANGE BY NEUROLOGIST.

## 2023-04-21 LAB — COPPER, SERUM: COPPER: 129 ug/dL (ref 70–175)

## 2023-04-23 ENCOUNTER — Encounter (INDEPENDENT_AMBULATORY_CARE_PROVIDER_SITE_OTHER): Payer: Self-pay | Admitting: NEUROLOGY

## 2023-04-23 DIAGNOSIS — I252 Old myocardial infarction: Secondary | ICD-10-CM

## 2023-04-23 DIAGNOSIS — I444 Left anterior fascicular block: Secondary | ICD-10-CM

## 2023-04-23 LAB — ECG 12 LEAD
Atrial Rate: 91 {beats}/min
Calculated P Axis: 88 degrees
Calculated R Axis: -50 degrees
Calculated T Axis: 85 degrees
PR Interval: 136 ms
QRS Duration: 88 ms
QT Interval: 336 ms
QTC Calculation: 413 ms
Ventricular rate: 91 {beats}/min

## 2023-04-24 ENCOUNTER — Telehealth (INDEPENDENT_AMBULATORY_CARE_PROVIDER_SITE_OTHER): Payer: Self-pay | Admitting: NEUROLOGY

## 2023-04-24 ENCOUNTER — Encounter (INDEPENDENT_AMBULATORY_CARE_PROVIDER_SITE_OTHER): Payer: Self-pay | Admitting: NEUROLOGY

## 2023-04-24 NOTE — Telephone Encounter (Signed)
Received a call from the patient stating that she was started on some new medication and has since been having some issues with her blood pressure. She went to the ER on 04/20/23 and had an abnormal EKG. She's wanting to know if she should continue taking the new medications or stop them for now.

## 2023-04-24 NOTE — Telephone Encounter (Signed)
Called patient back.    She reports that she has not been taking the meloxicam for fear that it is driving up her blood pressure. She even went to the ED 2 days after our clinic visit due to it being so high at home. No changes were made at the ED.    We discussed that her BP was elevated at time of visit even before starting the medication, which she agreed. However, she has been off the meloxicam for 2 days and her blood pressure is slowly decreasing.    She has an appointment on Monday with her PCP and the plan is to hold off taking the meloxicam until she can address her BP with her PCP. She is to continue taking her BP at home and bring in a log.    Additionally, she reports concern regarding her EKG which was done in the ED and was abnormal. Instructed her to address this with her PCP who might want to repeat it.

## 2023-04-26 LAB — VITAMIN B6 (PYRIDOXAL 5-PHOSPHATE), PLASMA: VITAMIN B6, PLASMA: 5.9 ng/mL (ref 2.1–21.7)

## 2023-05-08 ENCOUNTER — Encounter (INDEPENDENT_AMBULATORY_CARE_PROVIDER_SITE_OTHER): Payer: Self-pay

## 2023-05-16 ENCOUNTER — Ambulatory Visit
Admission: RE | Admit: 2023-05-16 | Discharge: 2023-05-16 | Disposition: A | Discharge status: Home / Self Care | Payer: Self-pay | Source: Ambulatory Visit | Attending: SLEEP MEDICINE | Admitting: SLEEP MEDICINE

## 2023-05-16 ENCOUNTER — Other Ambulatory Visit: Payer: Self-pay

## 2023-05-16 DIAGNOSIS — J449 Chronic obstructive pulmonary disease, unspecified: Secondary | ICD-10-CM | POA: Insufficient documentation

## 2023-05-16 DIAGNOSIS — Z87891 Personal history of nicotine dependence: Secondary | ICD-10-CM | POA: Insufficient documentation

## 2023-05-18 ENCOUNTER — Ambulatory Visit (HOSPITAL_COMMUNITY): Payer: Self-pay | Admitting: SLEEP MEDICINE

## 2023-05-21 ENCOUNTER — Ambulatory Visit (HOSPITAL_COMMUNITY): Payer: Self-pay

## 2023-06-12 ENCOUNTER — Ambulatory Visit (INDEPENDENT_AMBULATORY_CARE_PROVIDER_SITE_OTHER): Payer: Self-pay | Admitting: NEUROLOGY

## 2023-06-12 ENCOUNTER — Encounter (INDEPENDENT_AMBULATORY_CARE_PROVIDER_SITE_OTHER): Payer: Self-pay | Admitting: NEUROLOGY

## 2023-06-12 ENCOUNTER — Other Ambulatory Visit: Payer: Self-pay

## 2023-06-12 VITALS — BP 184/79 | HR 86 | Temp 97.0°F | Wt 165.0 lb

## 2023-06-12 MED ORDER — TRAZODONE 50 MG TABLET
50.0000 mg | ORAL_TABLET | Freq: Every evening | ORAL | 2 refills | Status: DC
Start: 2023-06-12 — End: 2023-06-28

## 2023-06-12 NOTE — Progress Notes (Signed)
 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 She has previously undergone MRI imaging, physical therapy, and trigger point injections, which provided temporary relief. She could not tolerate gabapentin , Lyrica, nor prior muscle relaxants. Did not tolerate meloxicam  and reports celecoxib did not help in past.  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 Normal labwork. Could not tolerate gabapentin . Has not tolerated Lyrica in the past.  CTM    Thank you for allowing me to participate in your patient's care and please do not hesitate to contact me for any questions or concerns.    On the day of the encounter, a total of 40 minutes was spent on this patient encounter including review of historical information, examination, documentation and post-visit activities. The time documented excludes procedural time.    Leanora Prophet, MD  Assistant Professor of Neurology  [place]  [facility]    =====================================================================    NAME:  Mckenzie Stewart  DOB:  Sep 15, 1955  VISIT DATE:  06/12/2023     CC:  Headache    Patient seen in consultation at the request of Darolyn Elks, NP   History obtained from the patient and chart/records  Age of patient:  68 y.o.    I had the pleasure of seeing Mckenzie Stewart for outpatient consultation, who is a 68 y.o. year old female and is being seen for management of above CC.    INTERVAL HISTORY:     06/12/2023   Mckenzie Stewart presents with ongoing head pain, which has improved with Cymbalta started 12 days ago, though she is experiencing improving daytime fatigue and insomnia as side effects. She discontinued gabapentin  and Singulair due to vertigo.  Trigger point injections provided short-term relief. She is considering using trazodone  for sleep and has a history of using methocarbamol and cyclobenzaprine for muscle relaxation but discontinued them due to side effects. A nerve block is scheduled for the 30th.     HPI:      04/23/2023  68 year old female presents with headache. Patient has undergone diagnostic imaging with MRIs, but results are currently unavailable. She has participated in physical therapy as part of her treatment plan. Additionally, the patient has received trigger point injections in the lower back and shoulder areas from Dr. Nicholette Barley in [place], an orthopedist, which provided symptom relief for approximately 10 days. The patient is actively exploring various treatment options, focusing on non-surgical interventions. She is considering the use of medications such as gabapentin  and Lyrica but expresses concerns regarding long-term medication use.     =====================================================================  PMHx  Patient Active Problem List   Diagnosis    Chronic obstructive pulmonary disease    Ex-smoker    Dyspnea    Cervicogenic headache     Past Surgical History:   Procedure Laterality Date    COLONOSCOPY      HX LIPOMA RESECTION      chin         Family Medical History:       Problem Relation (Age of Onset)    Breast Cancer Maternal Aunt    No Known Problems Mother, Father, Sister, Brother, Maternal Grandmother, Maternal Grandfather, Paternal Grandmother, Paternal Grandfather, Daughter, Son, Maternal Uncle, Paternal Aunt, Paternal Uncle, Other  Current Outpatient Medications   Medication Sig Dispense Refill    acetaminophen (TYLENOL) 500 mg Oral Tablet Take 2 Tablets (1,000 mg total) by mouth Every 6 hours as needed for Pain      acetylcysteine (NAC) 600 mg Oral Capsule Take 1 Capsule (600 mg total) by mouth Once a day      ascorbic acid (VITAMIN C) 1,000 mg Oral Tablet Take 1 Tablet (1,000 mg total) by mouth Once a  day      calcium citrate-vitamin D3 (CITRACAL) 200 mg-6.25 mcg (250 unit) Oral Tablet Take by mouth Once a day      chlorpheniramine maleate (CHLORPHEN SR ORAL) Take by mouth      cholecalciferol, vitamin D3, 250 mcg (10,000 unit) Oral Capsule Take 1 Capsule (10,000 Units total) by mouth Once a day      EPINEPHrine  0.3 mg/0.3 mL Injection Auto-Injector Inject 0.3 mL (0.3 mg total) into the muscle Once, as needed for up to 1 dose 2 Each 0    gabapentin  (NEURONTIN ) 100 mg Oral Capsule Take 1 Capsule (100 mg total) by mouth Three times a day 90 Capsule 1    guaifenesin (MUCINEX) 1,200 mg Oral Tablet Extended Release 12hr Take 1 Tablet (1,200 mg total) by mouth Every morning      guaiFENesin 100 mg/5 mL Oral Liquid Take 5 mL (100 mg total) by mouth Every night      Ibuprofen (MOTRIN) 200 mg Oral Tablet Take 1 Tablet (200 mg total) by mouth Four times a day as needed for Pain      ipratropium-albuterol  0.5 mg-3 mg(2.5 mg base)/3 mL Solution for Nebulization Take 3 mL by nebulization Four times a day Indications: 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     No Known Allergies  Social History     Socioeconomic History    Marital status: Married     Spouse name: Not on file    Number of children: Not on file    Years of education: Not on file    Highest education level: Not on file   Occupational History    Not on file   Tobacco Use    Smoking status: Former     Current packs/day: 0.00     Average packs/day: 1 pack/day for 42.0 years (42.0 ttl pk-yrs)     Types: Cigarettes     Start date: 53     Quit date: 2010     Years since  quitting: 15.2    Smokeless tobacco: Never    Tobacco comments:     Counseled by Fairy Homer FNP on 12/28/2022   Vaping Use    Vaping status: Never Used   Substance and Sexual Activity    Alcohol use: Never    Drug use: Never    Sexual activity: Not on file   Other Topics Concern    Not on file   Social History Narrative    Not on file     Social Determinants of Health  Financial Resource Strain: Not on file   Transportation Needs: Not on file   Social Connections: Not on file   Intimate Partner Violence: Not on file   Housing Stability: Not on file       =====================================================================  GENERAL EXAMINATION  BP (!) 184/79   Pulse 86   Temp 36.1 C (97 F)   Wt 74.8 kg (165 lb)   SpO2 98%   BMI 25.09 kg/m       Vital signs personally reviewed    General: No acute distress, alert  HEENT: Normocephalic, no scleral icterus  Pulmonary: No accessory muscle use, no tachypnea  Cardiovascular: Heart with regular rate & rhythm  Extremities: No significant edema, No cyanosis    NEUROLOGIC EXAM  MENTAL STATUS: alert and oriented to person, place, time, and situation; speech clear and fluent with good repetition and comprehension; naming intact; attention/concentration normal; recent and remote memory intact with normal fund of knowledge; able to follow simple and complex axial and appendicular commands without L/R confusion.    CN  II: not directly tested, grossly intact  III, IV, VI: extraocular movements intact without nystagmus  V: intact to light touch  VII: face symmetric without weakness  VIII: grossly intact  IX, X: symmetric palatal elevation  XI: normal strength of trapezius and sternocleidomastoid bilaterally  XII: tongue midline with full movements    MOTOR  Bulk: normal  Tone: normal  Abnormal Movements: none  Fasciculations: none    Strength: Patient moving all extremities symmetrically and against gravity with good strength.    Reflexes: Diminished    Sensory:   Decreased in length-dependent fashion of bilateral lower extremities    Coordination:  No dysmetria    Gait:  Normal    =====================================================================  DATA  Personal review of prior labs is notable for: 04/18/23 normal VITAMIN B12, FOLATE, VITAMIN D  25 TOTAL, COPPER , VITAMIN B6, HGA1C, FERRITIN, IRON TRANSFERRIN AND TIBC   VITAMIN B 12   Date Value Ref Range Status   04/18/2023 1,262 (H) 180 - 914 pg/mL Final     FOLATE   Date Value Ref Range Status   04/18/2023 33.2 (H) 5.9 - 24.8 ng/mL Final     HEMOGLOBIN A1C   Date Value Ref Range Status   04/18/2023 6.0 4.0 - 6.0 % Final     Personal review of imaging (with independent interpretation) is notable for: not available for review   Personal Review of other prior diagnostics is notable for: not available for review   =====================================================================    Orders  Orders Placed This Encounter    traZODone  (DESYREL ) 50 mg Oral Tablet

## 2023-06-27 ENCOUNTER — Other Ambulatory Visit: Payer: Self-pay

## 2023-06-28 ENCOUNTER — Ambulatory Visit: Payer: Self-pay | Attending: SLEEP MEDICINE | Admitting: SLEEP MEDICINE

## 2023-06-28 ENCOUNTER — Encounter (HOSPITAL_COMMUNITY): Payer: Self-pay | Admitting: SLEEP MEDICINE

## 2023-06-28 ENCOUNTER — Other Ambulatory Visit: Payer: Self-pay

## 2023-06-28 VITALS — BP 173/63 | HR 88 | Temp 98.1°F | Resp 18 | Ht 68.0 in | Wt 163.0 lb

## 2023-06-28 DIAGNOSIS — I10 Essential (primary) hypertension: Secondary | ICD-10-CM | POA: Insufficient documentation

## 2023-06-28 DIAGNOSIS — J449 Chronic obstructive pulmonary disease, unspecified: Secondary | ICD-10-CM

## 2023-06-28 DIAGNOSIS — R918 Other nonspecific abnormal finding of lung field: Secondary | ICD-10-CM | POA: Insufficient documentation

## 2023-06-28 DIAGNOSIS — M542 Cervicalgia: Secondary | ICD-10-CM | POA: Insufficient documentation

## 2023-06-28 DIAGNOSIS — Z79899 Other long term (current) drug therapy: Secondary | ICD-10-CM | POA: Insufficient documentation

## 2023-06-28 DIAGNOSIS — Z87891 Personal history of nicotine dependence: Secondary | ICD-10-CM | POA: Insufficient documentation

## 2023-06-28 DIAGNOSIS — J432 Centrilobular emphysema: Secondary | ICD-10-CM | POA: Insufficient documentation

## 2023-06-28 DIAGNOSIS — R251 Tremor, unspecified: Secondary | ICD-10-CM | POA: Insufficient documentation

## 2023-06-28 DIAGNOSIS — R0600 Dyspnea, unspecified: Secondary | ICD-10-CM

## 2023-06-28 DIAGNOSIS — R911 Solitary pulmonary nodule: Secondary | ICD-10-CM

## 2023-06-28 MED ORDER — BREZTRI AEROSPHERE 160 MCG-9MCG-4.8MCG/ACTUATION HFA AEROSOL INHALER
2.0000 | INHALATION_SPRAY | Freq: Two times a day (BID) | RESPIRATORY_TRACT | 1 refills | Status: DC
Start: 2023-06-28 — End: 2023-12-31

## 2023-06-28 NOTE — Assessment & Plan Note (Signed)
 Orders:    CT Chest WO; Future

## 2023-06-28 NOTE — Progress Notes (Unsigned)
 PULMONARY, [facility]  [address]  [place] [address]  Operated by [facility]     Follow up/Progress Note    Patient Name: Mckenzie Stewart  Date: 06/28/2023  Department:  PULMONARY, [facility]  MRN: [id number]  DOB: 06/03/1955  Primary Care Provider:  Darolyn Elks, NP  Referring Provider:  No ref. provider found      Chief Complaint:   Chief Complaint   Patient presents with    Follow Up     PT here for follow up and  PT had lung scan done         HPI:  Mckenzie Stewart is a 68 y.o., White female to follow up for breathing issues and Chest CT scan results.      LDCT done 05/16/23 showing new 5 mm nodule right upper lobe.  Several other smaller nodules both lungs.  Mucous plugs present.  Considered benign.  Hyperinflated lungs with moderate emphysema. Prior multifocal groundglass opacities in 2022 mostly resolved. Residual groundglass opacity in lingula, likely scarring.  Recommend follow up in 6 months.     BP been elevated and PCP recently increase BP med. Having a lot of cervicalgia and pain in neck that radiates down arms and tremors in both hands.  Seeing neurologist and scheduled to go in for a nerve block with Dr Reinhold Carbine next week.    PFT not done due to pain.  Asking to put that off until have it and heart checked.    Has appt with cardiologist- Dr Martha Slack.      Shortness of breath stable on Breztri.      Former smoker. Had quit smoking in 2010.     Past Medical History:  Past Medical History:   Diagnosis Date    COPD (chronic obstructive pulmonary disease)     Hypertension     Lumbar pain     Vitamin D deficiency      Past Surgical History  Past Surgical History:   Procedure Laterality Date    COLONOSCOPY      HX LIPOMA RESECTION      chin     Medication List  Current Outpatient Medications   Medication Sig    acetaminophen (TYLENOL) 500 mg Oral Tablet Take 2 Tablets (1,000 mg total) by mouth Every 6 hours as needed for Pain    acetylcysteine  (NAC) 600 mg Oral Capsule Take 1 Capsule (600 mg total) by mouth Daily    ascorbic acid (VITAMIN C) 1,000 mg Oral Tablet Take 1 Tablet (1,000 mg total) by mouth Daily    calcium citrate-vitamin D3 (CITRACAL) 200 mg-6.25 mcg (250 unit) Oral Tablet Take by mouth Once a day    chlorpheniramine maleate (CHLORPHEN SR ORAL) Take by mouth    cholecalciferol, vitamin D3, 250 mcg (10,000 unit) Oral Capsule Take 1 Capsule (10,000 Units total) by mouth Daily    DULoxetine (CYMBALTA DR) 30 mg Oral Capsule, Delayed Release(E.C.) Take 1 Capsule (30 mg total) by mouth Daily    guaifenesin (MUCINEX) 1,200 mg Oral Tablet Extended Release 12hr Take 1 Tablet (1,200 mg total) by mouth Every morning    Ibuprofen (MOTRIN) 200 mg Oral Tablet Take 1 Tablet (200 mg total) by mouth Four times a day as needed for Pain    ipratropium-albuterol 0.5 mg-3 mg(2.5 mg base)/3 mL Solution for Nebulization Take 3 mL by nebulization Four times a day Indications: bronchi muscle spasm resulting from COPD    losartan (COZAAR) 50 mg  Oral Tablet Take 2 Tablets (100 mg total) by mouth Daily     Allergy List  Allergy History as of 06/28/23        No Known Allergies                  Family History   Family Medical History:       Problem Relation (Age of Onset)    Breast Cancer Maternal Aunt    No Known Problems Mother, Father, Sister, Brother, Maternal Grandmother, Maternal Grandfather, Paternal Grandmother, Paternal Grandfather, Daughter, Son, Maternal Uncle, Paternal Aunt, Paternal Uncle, Other            Social History  Social History     Socioeconomic History    Marital status: Married   Tobacco Use    Smoking status: Former     Current packs/day: 0.00     Average packs/day: 1 pack/day for 42.0 years (42.0 ttl pk-yrs)     Types: Cigarettes     Start date: 57     Quit date: 2010     Years since quitting: 15.3    Smokeless tobacco: Never    Tobacco comments:     Counseled by Fairy Homer FNP on 12/28/2022   Vaping Use    Vaping status: Never Used   Substance  and Sexual Activity    Alcohol use: Never    Drug use: Never        Review of system  General:  Denies fever, chills, night sweats, loss of appetite.  Neurological:  Denies dizziness or seizures.  Gastrointestinal:  Denies uncontrolled reflux, heartburn, or diarrhea.  Cardiovascular:  Denies chest pain or irregular heartbeats.  Respiratory: see HPI  Musculoskeletal:  Denies uncontrolled joint pain or restless legs.  Endocrine/Metabolic:  Denies weight gain or weight loss.  Mental Status/Psychiatric:  Denies uncontrolled anxiety or depression.  Integumentary:  Denies rash or new abnormal skin lesions.    Objective:  Vital Signs  Vitals:    06/28/23 1341   BP: (!) 173/63   Pulse: 88   Resp: 18   Temp: 36.7 C (98.1 F)   TempSrc: Oral   SpO2: 97%   Weight: 73.9 kg (163 lb)   Height: 1.727 m (5\' 8" )   BMI: 24.78          PHYSICAL EXAMINATION:   Constitutional:  Vital signs stable.  General appearance of the patient:  Alert, no acute distress.  Normal appearance, well nourished.  Eyes:  Normal eye lids.  Conjunctiva normal.  Ears, Nose, Mouth, and Throat: External inspection of ears and nose with normal appearance.  Nares patent.  Neck: Supple with trachea midline, non tender, no nodules, no masses, gland position midline.  Muscles stiff in neck.    Respiratory:  Auscultation of lungs with  breath sounds, no rales, no rhonchi, no wheezing.  Respiratory effort with no tractions, breathing regular and unlabored.  Cardiovascular:  Regular rhythm and regular rate.  No murmur, no peripheral edema.  Gastrointestinal: Abdomen non-tender.  Musculoskeletal:  Normal gait and station, normal digits, no digital cyanosis or clubbing.  Mental Status/Psychiatric:  Alert- oriented to person, place, and time.  Appropriate and normal mood.    Imaging  CT LUNG CANCER SCREENING PROGRAM BASELINE  Narrative: [place]    CT LUNG CANCER SCREENING PROGRAM BASELINE    CLINICAL HISTORY:  Lung cancer screening (low dose CT). Smoking  history. J44.9: Chronic obstructive pulmonary disease, unspecified COPD type (CMS HCC)  Z87.891: Ex-smoker.  former 1ppd smoker x 1yrs, been quit 79yrs    TECHNIQUE:  Low-dose lung cancer screening CT protocol without intravenous contrast    COMPARISON:  05/30/2020, 02/12/2019.    FINDINGS:  Nodules described below are on series 3 unless otherwise specified:    PULMONARY NODULES: (Nodules <56mm may not be reported.)    New 5 mm nodule in the right upper lobe posterior segment (3/88).    Several additional smaller nodules in both lungs.   Some were present in 2020, considered benign (for examples, images 104, 223, 278).   Several with appearance of small airway mucous plugs.    Hardware:  None.    Lymph nodes:   No mediastinal, hilar, or axillary lymphadenopathy.    Heart and Vasculature:  Normal heart size.  No pericardial effusion. Mild atherosclerotic calcifications of the thoracic aorta.  Thoracic aorta and pulmonary arteries have normal contours; noncontrast technique limits evaluation.      Coronary Artery Calcifications: Mild    Lungs and Airways (Other):  Hyperinflated lungs with moderate emphysematous changes. Trace mucous in the trachea and bilateral large airways.    Prior multifocal groundglass opacities in 2022 have mostly resolved. Residual faint groundglass opacity in the lingula (3/194), likely scarring sequela of prior inflammation.    Pleura: No pleural effusion.  No pneumothorax.    Upper Abdomen: Visualized portions of the upper abdominal viscera are unremarkable.    Bones: Mild degenerative changes of the thoracic spine.  Impression: 1. NEW 5 MM NODULE IN THE RIGHT UPPER LOBE. LUNG RADS SCORE IS 3 - PROBABLY BENIGN (BASED ON IMAGING FEATURES OR BEHAVIOR). RECOMMEND 65-MONTH LDCT..          2. SMOKING CESSATION COUNSELING IS RECOMMENDED IF THE PATIENT IS STILL SMOKING.    3. OTHER SIGNIFICANT FINDINGS (S) (IF ANY): MODERATE EMPHYSEMA. CHRONIC BRONCHITIS.    One or more dose reduction techniques were  used (e.g., Automated exposure control, adjustment of the mA and/or kV according to patient size, use of iterative reconstruction technique).    _________________________________________________________________________  _________________________________________________________________________    The following is for internal use and reference only:  Has the Lung RADS score been entered into EPIC?    Summary of Lung-RADS 2022 Assessment Categories       Overall Lung-RADS is determined by the most concerning nodule.      Additional information is available at [email or web address]    0 - Incomplete    1 - Negative (No nodules or nodule with benign features)           Complete, central, popcorn, or concentric ring calcifications OR fat-containing    2 - Benign (Based on imaging features or indolent behavior).      Juxtapleural nodule: <73mm AND solid; smooth margins; oval, lentiform, or triangular shape      Solid nodule: <31mm at baseline or new <21mm      Part solid nodule: <4mm total mean diam. at baseline      Non-solid nodule: (GGN) <101mm OR>= 30mm stable or slowly growing      Airway nodule, subsegmental at baseline, new, or stable      Category 3 nodule stable or decreased in size at 41-month follow-up CT, OR          Category 3 or 4A nodules that resolve on follow-up, OR          Category 4B findings proven to be benign following diagnostic workup    3 - Probably Benign (Based on imaging features or  behavior)      Solid Nodule: >= 6 to <78mm at baseline OR new 4 to <57mm      Part-solid nodule: >= 6mm total mean diam. with solid component <30mm at baseline           OR new <75mm total mean diam.      Non-solid nodule:  GGN >= 30mm at baseline or new      Atypical pulmonary cyst: Growing cystic component (mean diam.) of thick-walled cyst      Category 4A nodule stable or decreased in size at 74-month follow-up CT (excl.airway).    4A - Suspicious      Solid nodule: >=8 to <31mm at baseline OR growing <56mm OR new 6 to <53mm       Part solid nodule: >= 6mm total mean diam. w/ solid component >= 6mm to <42mm at baseline          OR new or growing <54mm solid component      Airway nodule, segmental or more proximal at baseline or new      Atypical pulmonary cyst: Thick-walled OR multilocular at baseline OR becomes multilocular    4B - Very Suspicious      Airway nodule, segmental or more proximal, and stable or growing      Solid nodule: >=62mm at baseline OR new or growing >= 8mm      Part solid nodule: Solid component >= 8mm OR new or growing >=54mm solid component      Atypical pulmonary cyst: Thick-walled with growing wall thickness/nodularity OR          Growing multilocular (mean diam.) OR           Multilocular with increased loculation or new/increased opacity      Slow-growing solid or part solid nodule w/ growth over multiple screening exams    4X - Very Suspicious       Category 3 or 4 nodules with additional features that increase the suspicion for lung cancer.    S - Clinically Significant or potentially significant findings (non-lung cancer)    Radiologist location ID: [id number]        Assessment & Plan  Pulmonary nodule    Orders:    CT Chest WO; Future    Centrilobular emphysema (CMS HCC)    Orders:    CT Chest WO; Future         Instructions  Continue medications as prescribed/directed unless changed by provider.  Plan of care discussed with patient.    No follow-ups on file.    The patient was given the opportunity to ask questions and those questions were answered to the patient's satisfaction. The patient was encouraged to call with any additional questions or concerns. Discussed with the patient effects and side effects of medications. Medication safety was discussed.  The patient was informed to contact the office within 7 business days if a message/lab results/referral/imaging results have not been conveyed to the patient.    Electronically signed by Fairy Homer, FNP-BC       This note may have been partially generated  using MModal Fluency Direct system, and there may be some incorrect words, spellings, and punctuation that were not noted in checking the note before saving.

## 2023-06-29 ENCOUNTER — Encounter (HOSPITAL_COMMUNITY): Payer: Self-pay

## 2023-07-11 ENCOUNTER — Other Ambulatory Visit: Payer: Self-pay

## 2023-07-11 ENCOUNTER — Ambulatory Visit (INDEPENDENT_AMBULATORY_CARE_PROVIDER_SITE_OTHER): Payer: Self-pay | Admitting: NEUROLOGY

## 2023-07-11 ENCOUNTER — Encounter (INDEPENDENT_AMBULATORY_CARE_PROVIDER_SITE_OTHER): Payer: Self-pay | Admitting: NEUROLOGY

## 2023-07-11 VITALS — BP 179/70 | HR 96 | Temp 98.6°F | Wt 163.0 lb

## 2023-07-11 DIAGNOSIS — D509 Iron deficiency anemia, unspecified: Secondary | ICD-10-CM

## 2023-07-11 DIAGNOSIS — M5481 Occipital neuralgia: Secondary | ICD-10-CM

## 2023-07-11 MED ORDER — DEXAMETHASONE SODIUM PHOSPHATE 4 MG/ML INJECTION SOLUTION
4.0000 mg | INTRAMUSCULAR | Status: AC
Start: 2023-07-11 — End: 2023-07-11
  Administered 2023-07-11: 4 mg via INTRAMUSCULAR

## 2023-07-11 MED ORDER — BUPIVACAINE HCL 0.5 % (5 MG/ML) INJECTION SOLUTION
2.0000 mL | INTRAMUSCULAR | Status: AC
Start: 2023-07-11 — End: 2023-07-11
  Administered 2023-07-11: 2 mL via INTRAMUSCULAR

## 2023-07-11 NOTE — Procedures (Signed)
 NEUROLOGY, [facility]  [address]  [place] [address]    Procedure Note    Name: Mckenzie Stewart MRN:  [id number]   Date: 07/11/2023 DOB:  1956/02/05 (67 y.o.)         Nerve Block-ProcDoc    Date/Time: 07/11/2023 3:04 PM    Performed by: Leanora Prophet, MD  Authorized by: Leanora Prophet, MD      The greater occipital nerve was located by palaption bilaterally and injected with a combination of 1 cc of bupivacaine  (0.5% - 5mg /ml) and 1/2 cc of decadron  (4mg /ml) for each injection using a 27 gauge needle and 3cc syringe.  The patient tolerated the procedure without difficulty and with minimal blood loss. She was advised to apply ice or heat to the area of injection if soreness is noted.    Leanora Prophet, MD 07/11/2023, 15:04

## 2023-07-12 ENCOUNTER — Other Ambulatory Visit (INDEPENDENT_AMBULATORY_CARE_PROVIDER_SITE_OTHER): Payer: Self-pay | Admitting: NEUROLOGY

## 2023-07-12 MED ORDER — DULOXETINE 30 MG CAPSULE,DELAYED RELEASE
30.0000 mg | DELAYED_RELEASE_CAPSULE | Freq: Every day | ORAL | 1 refills | Status: DC
Start: 2023-07-12 — End: 2023-08-09

## 2023-08-09 ENCOUNTER — Ambulatory Visit (INDEPENDENT_AMBULATORY_CARE_PROVIDER_SITE_OTHER): Payer: Self-pay | Admitting: NEUROLOGY

## 2023-08-09 ENCOUNTER — Other Ambulatory Visit: Payer: Self-pay

## 2023-08-09 ENCOUNTER — Encounter (INDEPENDENT_AMBULATORY_CARE_PROVIDER_SITE_OTHER): Payer: Self-pay | Admitting: NEUROLOGY

## 2023-08-09 VITALS — BP 179/80 | HR 91 | Temp 97.9°F | Ht 69.0 in | Wt 162.0 lb

## 2023-08-09 DIAGNOSIS — G629 Polyneuropathy, unspecified: Secondary | ICD-10-CM

## 2023-08-09 DIAGNOSIS — G4486 Cervicogenic headache: Secondary | ICD-10-CM

## 2023-08-09 DIAGNOSIS — M5481 Occipital neuralgia: Secondary | ICD-10-CM

## 2023-08-09 MED ORDER — DULOXETINE 30 MG CAPSULE,DELAYED RELEASE: 30.0000 mg | DELAYED_RELEASE_CAPSULE | Freq: Two times a day (BID) | ORAL | 1 refills | Status: DC

## 2023-08-09 MED ORDER — BACLOFEN 10 MG TABLET
20.0000 mg | ORAL_TABLET | Freq: Every evening | ORAL | 3 refills | Status: AC
Start: 2023-08-09 — End: ?

## 2023-08-09 NOTE — Progress Notes (Signed)
 NEUROLOGY, [facility]  [address]  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 She has previously undergone MRI imaging, physical therapy, and trigger point injections, which provided temporary relief. She could not tolerate gabapentin , Lyrica, nor prior Robaxin (Cardiac complications). Did not tolerate meloxicam  and reports celecoxib did not help in past. She is interested in trying Baclofen.  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 Normal labwork. Could not tolerate gabapentin . Has not tolerated Lyrica in the past.  CTM    Thank you for allowing me to participate in your patient's care and please do not hesitate to contact me for any questions or concerns.    On the day of the encounter, a total of 40 minutes was spent on this patient encounter including review of historical information, examination, documentation and post-visit activities. The time documented excludes procedural time.    Leanora Prophet, MD  Assistant Professor of Neurology  [place]  [facility]    =====================================================================    NAME:  Anahla Bevis  DOB:  1955/08/30  VISIT DATE:  08/09/2023     CC:  Headache    Patient seen in consultation at the request of Darolyn Elks, NP   History obtained from the patient and chart/records  Age of patient:  68 y.o.    I had the pleasure of seeing Lucciana for outpatient consultation, who is a 68 y.o. year old female and is being seen for management of above CC.    INTERVAL HISTORY:     08/09/2023  Candice Chalet, a female with chronic headaches and anxiety, presented for follow-up after nerve  block injections. She reported partial improvement in head pain but continued shoulder, neck, and facial pressure discomfort. Her home blood pressure was 148/84 mmHg, but was 179/80 in office. She experienced excessive bruising, possibly related to regular ibuprofen use. Treatment plan included increasing Cymbalta  from 30 mg daily to30 mgg twice daily for headaches and anxiety, starting baclofen 120 mgmg at bedtime for muscle relaxation, scheduling next Botox injection in 2 months, and following up with her cardiologist in June for hypertension management.    HPI:      06/12/2023   Daren Eck presents with ongoing head pain, which has improved with Cymbalta  started 12 days ago, though she is experiencing improving daytime fatigue and insomnia as side effects. She discontinued gabapentin  and Singulair due to vertigo. Trigger point injections provided short-term relief. She is considering using trazodone  for sleep and has a history of using methocarbamol and cyclobenzaprine for muscle relaxation but discontinued them due to side effects. A nerve block is scheduled for the 30th.     04/23/2023  68 year old female presents with headache. Patient has undergone diagnostic imaging with MRIs, but results are currently unavailable. She has participated in physical therapy as part of her treatment plan. Additionally, the patient has received trigger point injections in the lower back and shoulder areas from Dr. Nicholette Barley in [place], an orthopedist, which provided symptom relief for approximately 10 days. The patient is actively exploring various treatment options, focusing on non-surgical interventions. She is considering the use of medications such as gabapentin  and Lyrica but expresses concerns regarding long-term  medication use.     =====================================================================  PMHx  Patient Active Problem List   Diagnosis    Chronic obstructive pulmonary disease    Ex-smoker    Dyspnea     Cervicogenic headache    Neuropathy (CMS HCC)     Past Surgical History:   Procedure Laterality Date    COLONOSCOPY      HX LIPOMA RESECTION      chin     Family Medical History:       Problem Relation (Age of Onset)    Breast Cancer Maternal Aunt    No Known Problems Mother, Father, Sister, Brother, Maternal Grandmother, Maternal Grandfather, Paternal Grandmother, Paternal Grandfather, Daughter, Son, Maternal Uncle, Paternal Aunt, Paternal Uncle, Other            Current Outpatient Medications   Medication Sig Dispense Refill    acetaminophen (TYLENOL) 500 mg Oral Tablet Take 2 Tablets (1,000 mg total) by mouth Every 6 hours as needed for Pain      acetylcysteine (NAC) 600 mg Oral Capsule Take 1 Capsule (600 mg total) by mouth Daily      ascorbic acid (VITAMIN C) 1,000 mg Oral Tablet Take 1 Tablet (1,000 mg total) by mouth Daily      budesonide-glycopyr-formoterol (BREZTRI  AEROSPHERE) 160-9-4.8 mcg/actuation Inhalation HFA Aerosol Inhaler Take 2 Puffs by inhalation Twice daily Rinse mouth after each use 31 g 1    calcium citrate-vitamin D3 (CITRACAL) 200 mg-6.25 mcg (250 unit) Oral Tablet Take by mouth Once a day      chlorpheniramine maleate (CHLORPHEN SR ORAL) Take by mouth      cholecalciferol, vitamin D3, 250 mcg (10,000 unit) Oral Capsule Take 1 Capsule (10,000 Units total) by mouth Daily      DULoxetine  (CYMBALTA  DR) 30 mg Oral Capsule, Delayed Release(E.C.) Take 1 Capsule (30 mg total) by mouth Daily 90 Capsule 1    guaifenesin (MUCINEX) 1,200 mg Oral Tablet Extended Release 12hr Take 1 Tablet (1,200 mg total) by mouth Every morning      Ibuprofen (MOTRIN) 200 mg Oral Tablet Take 1 Tablet (200 mg total) by mouth Four times a day as needed for Pain      ipratropium-albuterol  0.5 mg-3 mg(2.5 mg base)/3 mL Solution for Nebulization Take 3 mL by nebulization Four times a day Indications: bronchi muscle spasm resulting from COPD 120 Each 2    losartan (COZAAR) 50 mg Oral Tablet Take 2 Tablets (100 mg total) by  mouth Daily       No current facility-administered medications for this visit.     No Known Allergies  Social History     Socioeconomic History    Marital status: Married     Spouse name: Not on file    Number of children: Not on file    Years of education: Not on file    Highest education level: Not on file   Occupational History    Not on file   Tobacco Use    Smoking status: Former     Current packs/day: 0.00     Average packs/day: 1 pack/day for 42.0 years (42.0 ttl pk-yrs)     Types: Cigarettes     Start date: 93     Quit date: 2010     Years since quitting: 15.4    Smokeless tobacco: Never    Tobacco comments:     Counseled by Fairy Homer FNP on 12/28/2022   Vaping Use    Vaping status: Never Used  Substance and Sexual Activity    Alcohol use: Never    Drug use: Never    Sexual activity: Not on file   Other Topics Concern    Not on file   Social History Narrative    Not on file     Social Determinants of Health     Financial Resource Strain: Not on file   Transportation Needs: Not on file   Social Connections: Not on file   Intimate Partner Violence: Not on file   Housing Stability: Not on file       =====================================================================  GENERAL EXAMINATION  BP (!) 179/80   Pulse 91   Temp 36.6 C (97.9 F)   Ht 1.753 m (5\' 9" )   Wt 73.5 kg (162 lb)   SpO2 97%   BMI 23.92 kg/m     Vital signs personally reviewed    General: No acute distress, alert  HEENT: Normocephalic, no scleral icterus  Pulmonary: No accessory muscle use, no tachypnea  Cardiovascular: Heart with regular rate & rhythm  Extremities: No significant edema, No cyanosis    NEUROLOGIC EXAM  MENTAL STATUS: alert and oriented to person, place, time, and situation; speech clear and fluent with good repetition and comprehension; naming intact; attention/concentration normal; recent and remote memory intact with normal fund of knowledge; able to follow simple and complex axial and appendicular commands without  L/R confusion.    CN  II: not directly tested, grossly intact  III, IV, VI: extraocular movements intact without nystagmus  V: intact to light touch  VII: face symmetric without weakness  VIII: grossly intact  IX, X: symmetric palatal elevation  XI: normal strength of trapezius and sternocleidomastoid bilaterally  XII: tongue midline with full movements    MOTOR  Bulk: normal  Tone: normal  Abnormal Movements: none  Fasciculations: none    Strength: Patient moving all extremities symmetrically and against gravity with good strength.    Reflexes: Diminished    Sensory:  Decreased in length-dependent fashion of bilateral lower extremities    Coordination:  No dysmetria    Gait:  Normal    =====================================================================  DATA  Personal review of prior labs is notable for: 04/18/23 normal VITAMIN B12, FOLATE, VITAMIN D  25 TOTAL, COPPER , VITAMIN B6, HGA1C, FERRITIN, IRON TRANSFERRIN AND TIBC   VITAMIN B 12   Date Value Ref Range Status   04/18/2023 1,262 (H) 180 - 914 pg/mL Final     FOLATE   Date Value Ref Range Status   04/18/2023 33.2 (H) 5.9 - 24.8 ng/mL Final     HEMOGLOBIN A1C   Date Value Ref Range Status   04/18/2023 6.0 4.0 - 6.0 % Final     Personal review of imaging (with independent interpretation) is notable for: not available for review   Personal Review of other prior diagnostics is notable for: not available for review   =====================================================================    Orders  Orders Placed This Encounter    DULoxetine  (CYMBALTA  DR) 30 mg Oral Capsule, Delayed Release(E.C.)    Baclofen (LIORESAL) 10 mg Oral Tablet

## 2023-09-13 ENCOUNTER — Telehealth (INDEPENDENT_AMBULATORY_CARE_PROVIDER_SITE_OTHER): Payer: Self-pay | Admitting: NEUROLOGY

## 2023-09-13 NOTE — Telephone Encounter (Signed)
 Patient is wanting to know if she can take both of her Cymbalta  in the morning instead of twice daily.  She said it helps with the pain but not with her anxiety.  About mid day it creeps up on her

## 2023-09-17 ENCOUNTER — Telehealth (INDEPENDENT_AMBULATORY_CARE_PROVIDER_SITE_OTHER): Payer: Self-pay | Admitting: NEUROLOGY

## 2023-09-17 NOTE — Telephone Encounter (Signed)
 Patient wants to discuss Botox.  She stated that the nerve block didn't last as long as she had expected and would like to discuss the Botox with you before the nerve block to see if that would be a better option.

## 2023-09-24 ENCOUNTER — Ambulatory Visit
Admission: RE | Admit: 2023-09-24 | Discharge: 2023-09-24 | Disposition: A | Discharge status: Home / Self Care | Source: Ambulatory Visit | Attending: NURSE PRACTITIONER

## 2023-09-24 ENCOUNTER — Other Ambulatory Visit (HOSPITAL_COMMUNITY): Payer: Self-pay | Admitting: NURSE PRACTITIONER

## 2023-09-24 ENCOUNTER — Other Ambulatory Visit: Payer: Self-pay

## 2023-09-24 DIAGNOSIS — R9431 Abnormal electrocardiogram [ECG] [EKG]: Secondary | ICD-10-CM | POA: Insufficient documentation

## 2023-09-25 DIAGNOSIS — R9431 Abnormal electrocardiogram [ECG] [EKG]: Secondary | ICD-10-CM

## 2023-09-25 LAB — ECG 12 LEAD
Atrial Rate: 74 {beats}/min
Calculated P Axis: 83 degrees
Calculated R Axis: 22 degrees
Calculated T Axis: 81 degrees
PR Interval: 154 ms
QRS Duration: 86 ms
QT Interval: 380 ms
QTC Calculation: 421 ms
Ventricular rate: 74 {beats}/min

## 2023-10-09 ENCOUNTER — Other Ambulatory Visit: Payer: Self-pay

## 2023-10-10 ENCOUNTER — Ambulatory Visit (INDEPENDENT_AMBULATORY_CARE_PROVIDER_SITE_OTHER): Payer: Self-pay | Admitting: NEUROLOGY

## 2023-10-10 ENCOUNTER — Encounter (INDEPENDENT_AMBULATORY_CARE_PROVIDER_SITE_OTHER): Payer: Self-pay | Admitting: NEUROLOGY

## 2023-10-10 VITALS — BP 189/86 | HR 89 | Temp 98.4°F | Wt 158.0 lb

## 2023-10-10 DIAGNOSIS — G43711 Chronic migraine without aura, intractable, with status migrainosus: Secondary | ICD-10-CM

## 2023-10-10 MED ORDER — ONABOTULINUMTOXINA 100 UNIT SOLUTION FOR INJECTION
100.0000 [IU] | Freq: Once | INTRAMUSCULAR | Status: AC
Start: 2023-10-10 — End: 2023-10-10
  Administered 2023-10-10: 100 [IU] via INTRAMUSCULAR

## 2023-10-10 MED ORDER — ONABOTULINUMTOXINA 100 UNIT SOLUTION FOR INJECTION
55.0000 [IU] | Freq: Once | INTRAMUSCULAR | Status: AC
Start: 2023-10-10 — End: 2023-10-10
  Administered 2023-10-10: 55 [IU] via INTRAMUSCULAR

## 2023-10-10 NOTE — Addendum Note (Signed)
 Addended by: SONNA GARNETTE GASKINS on: 10/10/2023 02:56 PM     Modules accepted: Orders

## 2023-10-10 NOTE — Procedures (Addendum)
 NEUROLOGY, [facility]  [address]  [place] [address]    Procedure Note    Name: Mckenzie Stewart MRN:  [id number]   Date: 10/10/2023 DOB:  1955/04/12 (67 y.o.)         [phone] - MUSCLE(S) INNERVATED BY FACIAL, TRIGEMINAL, CERVICAL SPINAL AND ACCESSORY NERVES, BILATERAL (CHRONIC MIGRAINE) (AMB ONLY)    Date/Time: 10/10/2023 2:56 PM    Performed by: Sonna Senior, MD  Authorized by: Sonna Senior, MD      Botox  Visit Note    Neurotoxin Treatment Clinic: BTX (chemodenervation with botulinum toxin, Botox ) for CM (chronic migraine), PREEMPT protocol     Last Visit: N/A    Result History: N/A    [name] presents for Botox . She previously had a nerve block, but this is her first Botox .    Injections: 2:1 dilution BoNT-A (onabotulinum toxin A)    Location Left Right Center Number of Injections   Procerus   5 1   Corrugators 5 5  1  each   Frontalis 10 10  2  each   Temporalis 20 20  4  each   Occipitalis 15 15  3  each   Cervical Paraspinals 10 10  2  each   Trapezius 15 15  3  each   Total   155      Type of Toxin: A  Total Units: 155  Discarded Units: 45    RTC 12 weeks    Senior Sonna, MD  Assistant Professor of Neurology  [place]  [facility]

## 2023-11-13 ENCOUNTER — Encounter (INDEPENDENT_AMBULATORY_CARE_PROVIDER_SITE_OTHER): Payer: Self-pay | Admitting: NEUROLOGY

## 2023-11-16 ENCOUNTER — Other Ambulatory Visit (HOSPITAL_COMMUNITY): Payer: Self-pay | Admitting: NURSE PRACTITIONER

## 2023-11-16 DIAGNOSIS — Z1239 Encounter for other screening for malignant neoplasm of breast: Secondary | ICD-10-CM

## 2023-11-16 DIAGNOSIS — M810 Age-related osteoporosis without current pathological fracture: Secondary | ICD-10-CM

## 2023-11-19 ENCOUNTER — Ambulatory Visit
Admission: RE | Admit: 2023-11-19 | Discharge: 2023-11-19 | Disposition: A | Discharge status: Home / Self Care | Source: Ambulatory Visit | Attending: SLEEP MEDICINE

## 2023-11-19 ENCOUNTER — Other Ambulatory Visit (HOSPITAL_COMMUNITY): Payer: Self-pay | Admitting: SLEEP MEDICINE

## 2023-11-19 ENCOUNTER — Other Ambulatory Visit: Payer: Self-pay

## 2023-11-19 DIAGNOSIS — R911 Solitary pulmonary nodule: Secondary | ICD-10-CM

## 2023-11-19 DIAGNOSIS — J432 Centrilobular emphysema: Secondary | ICD-10-CM

## 2023-11-19 DIAGNOSIS — R0600 Dyspnea, unspecified: Secondary | ICD-10-CM

## 2023-11-19 DIAGNOSIS — J449 Chronic obstructive pulmonary disease, unspecified: Secondary | ICD-10-CM

## 2023-11-19 DIAGNOSIS — Z122 Encounter for screening for malignant neoplasm of respiratory organs: Secondary | ICD-10-CM

## 2023-11-26 ENCOUNTER — Telehealth (INDEPENDENT_AMBULATORY_CARE_PROVIDER_SITE_OTHER): Payer: Self-pay | Admitting: NEUROLOGY

## 2023-11-26 NOTE — Telephone Encounter (Signed)
 Patient called and stated that she has had diarrhea for the last 4 weeks and stated that it is the Cymbalta . She stopped the medication for 56 hours and the diarrhea stopped. She is on 60 mg and wants to know if she can go down on the mg? She stated that it was really working for the pain.

## 2023-11-27 ENCOUNTER — Ambulatory Visit (HOSPITAL_COMMUNITY): Payer: Self-pay | Admitting: SLEEP MEDICINE

## 2023-12-03 ENCOUNTER — Encounter (INDEPENDENT_AMBULATORY_CARE_PROVIDER_SITE_OTHER): Payer: Self-pay | Admitting: Surgery

## 2023-12-05 ENCOUNTER — Ambulatory Visit
Admission: RE | Admit: 2023-12-05 | Discharge: 2023-12-05 | Disposition: A | Discharge status: Home / Self Care | Source: Ambulatory Visit | Attending: NURSE PRACTITIONER | Admitting: NURSE PRACTITIONER

## 2023-12-05 ENCOUNTER — Ambulatory Visit (HOSPITAL_BASED_OUTPATIENT_CLINIC_OR_DEPARTMENT_OTHER)
Admission: RE | Admit: 2023-12-05 | Discharge: 2023-12-05 | Disposition: A | Discharge status: Home / Self Care | Source: Ambulatory Visit | Attending: NURSE PRACTITIONER

## 2023-12-05 ENCOUNTER — Other Ambulatory Visit: Payer: Self-pay

## 2023-12-05 ENCOUNTER — Encounter (HOSPITAL_COMMUNITY): Payer: Self-pay

## 2023-12-05 DIAGNOSIS — Z1239 Encounter for other screening for malignant neoplasm of breast: Secondary | ICD-10-CM

## 2023-12-05 DIAGNOSIS — Z1231 Encounter for screening mammogram for malignant neoplasm of breast: Secondary | ICD-10-CM | POA: Insufficient documentation

## 2023-12-05 DIAGNOSIS — M810 Age-related osteoporosis without current pathological fracture: Secondary | ICD-10-CM

## 2023-12-05 DIAGNOSIS — M8588 Other specified disorders of bone density and structure, other site: Secondary | ICD-10-CM

## 2023-12-11 ENCOUNTER — Encounter (INDEPENDENT_AMBULATORY_CARE_PROVIDER_SITE_OTHER): Payer: Self-pay | Admitting: NEUROLOGY

## 2023-12-11 ENCOUNTER — Other Ambulatory Visit: Payer: Self-pay

## 2023-12-11 ENCOUNTER — Ambulatory Visit (INDEPENDENT_AMBULATORY_CARE_PROVIDER_SITE_OTHER): Admitting: NEUROLOGY

## 2023-12-11 VITALS — BP 176/73 | HR 74 | Temp 98.0°F | Wt 152.4 lb

## 2023-12-11 DIAGNOSIS — M5481 Occipital neuralgia: Secondary | ICD-10-CM

## 2023-12-11 MED ORDER — DEXAMETHASONE SODIUM PHOSPHATE 4 MG/ML INJECTION SOLUTION
4.0000 mg | INTRAMUSCULAR | Status: AC
Start: 2023-12-11 — End: 2023-12-11
  Administered 2023-12-11: 4 mg via INTRAMUSCULAR

## 2023-12-11 MED ORDER — BUPIVACAINE HCL 0.5 % (5 MG/ML) INJECTION SOLUTION
2.0000 mL | INTRAMUSCULAR | Status: AC
Start: 2023-12-11 — End: 2023-12-11
  Administered 2023-12-11: 2 mL via INTRAMUSCULAR

## 2023-12-11 NOTE — Procedures (Signed)
 NEUROLOGY, [facility]  [address]  [place] [address]    Procedure Note    Name: Mckenzie Stewart MRN:  [id number]   Date: 12/11/2023 DOB:  Feb 06, 1956 (67 y.o.)         64405 - INJ ANESTHETIC/STEROID; GREATER OCCIPITAL NERVE (AMB ONLY-PD)    Date/Time: 12/11/2023 11:15 AM    Performed by: Sherre Senior, DO  Authorized by: Sherre Senior, DO    Nerve Block-ProcDoc    Date/Time: 12/11/2023 11:15 AM    Performed by: Sherre Senior, DO  Authorized by: Sherre Senior, DO      The greater occipital nerve was located by palaption bilaterally and both sites injected with a combination of 1 cc of bupivacaine  (0.5% - 5mg /ml) and 1/2 cc of decadron  (4mg /ml) for each injection using a 27 gauge needle and 3cc syringe.  The patient tolerated the procedure without difficulty and with minimal blood loss. She was advised to apply ice or heat to the area of injection if soreness is noted.    [name], DO  Assistant Professor of Neurology  [place]  [facility]

## 2023-12-31 ENCOUNTER — Ambulatory Visit: Attending: SLEEP MEDICINE | Admitting: SLEEP MEDICINE

## 2023-12-31 ENCOUNTER — Encounter (HOSPITAL_COMMUNITY): Payer: Self-pay | Admitting: SLEEP MEDICINE

## 2023-12-31 ENCOUNTER — Other Ambulatory Visit: Payer: Self-pay

## 2023-12-31 VITALS — BP 164/70 | HR 82 | Temp 99.0°F | Resp 20 | Ht 69.0 in | Wt 155.0 lb

## 2023-12-31 DIAGNOSIS — J449 Chronic obstructive pulmonary disease, unspecified: Secondary | ICD-10-CM | POA: Insufficient documentation

## 2023-12-31 DIAGNOSIS — M542 Cervicalgia: Secondary | ICD-10-CM | POA: Insufficient documentation

## 2023-12-31 DIAGNOSIS — R918 Other nonspecific abnormal finding of lung field: Secondary | ICD-10-CM

## 2023-12-31 DIAGNOSIS — R911 Solitary pulmonary nodule: Secondary | ICD-10-CM | POA: Insufficient documentation

## 2023-12-31 MED ORDER — BREZTRI AEROSPHERE 160 MCG-9MCG-4.8MCG/ACTUATION HFA AEROSOL INHALER
2.0000 | INHALATION_SPRAY | Freq: Two times a day (BID) | RESPIRATORY_TRACT | 1 refills | Status: AC
Start: 2023-12-31 — End: ?

## 2023-12-31 NOTE — Progress Notes (Unsigned)
 PULMONARY AND SLEEP DISORDERS, [facility]  [address]  [place] [address]  Operated by [facility]     Follow up/Progress Note    Patient Name: Mckenzie Stewart  Date: 12/31/2023  Department:  PULMONARY AND SLEEP DISORDERS, [facility]  MRN: [id number]  DOB: 03/20/1955  Primary Care Provider:  Powell Hoyle, APRN, CNP  Referring Provider:  No ref. provider found      Chief Complaint:   Chief Complaint   Patient presents with    Follow Up     Pt presents for a pulmonary follow up. CT of chest completed on 9/8. Continued use of Breztri .          History of Present Illness  Mckenzie Stewart is a 68 year old female with pulmonary nodules who presents for follow-up on her breathing.    She has been using Breztri  inhaler regularly and has not required albuterol . She prefers a 90-day supply from [employer]. Her breathing is generally stable, with oxygen saturation levels between 95-97% during the day. However, she experiences shortness of breath with increased physical activity, which improves with rest.    A recent CT scan was performed, and the patient recalls being told that there are multiple pulmonary nodules, which she was aware of previously. There are no new nodules. She has a history of smoking, having quit in 2010, and briefly vaped afterward, which she associates with the onset of shortness of breath. No exposure to birds, chickens, coal mines, or [employer] information [employer] that could contribute to her condition.    She has a history of COVID-19 and pneumonia. She uses Breztri  inhaler daily and reports that it helps maintain her breathing. She also mentions neck pain for which she receives Botox  injections and takes 16 mg of Cymbalta  in the morning along with Tylenol as needed.    She received the RSV vaccine last year and has already had her flu shot this year. She previously had concerns about heart issues but a recent echo was normal. She is currently  moving houses, which involves physical activity that contributes to her shortness of breath.     Shortness of breath stable on Breztri .       Former smoker. Had quit smoking in 2010.    Results  RADIOLOGY  Chest CT: Multiple nodules, largest 5 mm, stable size, calcified appearance, no new nodules, slight scarring in left lung. (05/2023)    DIAGNOSTIC REPORTS  Echocardiogram: Normal     Chest CT done on 11/19/2023 showing the previously described 5 mm pulmonary nodule in the right upper lobe is seen and continues to measure 5 mm in size.  There are stable pulmonary nodule nodules seen which measure less than 5 mm in size.  No new nodules are identified no mediastinal, hilar, or axillary lymphadenopathy noted.  The lungs are normally expanded and clear.  No pleural effusion.  No pneumothorax    Past Medical History:  Past Medical History:   Diagnosis Date    COPD (chronic obstructive pulmonary disease)     Hypertension     Lumbar pain     Vitamin D  deficiency      Past Surgical History  Past Surgical History:   Procedure Laterality Date    COLONOSCOPY      HX LIPOMA RESECTION      chin     Medication List  Current Outpatient Medications   Medication Sig    acetaminophen (TYLENOL) 500 mg Oral Tablet Take  2 Tablets (1,000 mg total) by mouth Every 6 hours as needed for Pain    acetylcysteine (NAC) 600 mg Oral Capsule Take 1 Capsule (600 mg total) by mouth Daily    ascorbic acid (VITAMIN C) 1,000 mg Oral Tablet Take 1 Tablet (1,000 mg total) by mouth Daily    Baclofen  (LIORESAL ) 10 mg Oral Tablet Take 2 Tablets (20 mg total) by mouth Every night    budesonide-glycopyr-formoterol (BREZTRI  AEROSPHERE) 160-9-4.8 mcg/actuation Inhalation HFA Aerosol Inhaler Take 2 Puffs by inhalation Twice daily Rinse mouth after each use    calcium citrate-vitamin D3 (CITRACAL) 200 mg-6.25 mcg (250 unit) Oral Tablet Take by mouth Once a day    chlorpheniramine maleate (CHLORPHEN SR ORAL) Take by mouth    cholecalciferol, vitamin D3, 250 mcg  (10,000 unit) Oral Capsule Take 1 Capsule (10,000 Units total) by mouth Daily    DULoxetine  (CYMBALTA  DR) 30 mg Oral Capsule, Delayed Release(E.C.) Take 1 Capsule (30 mg total) by mouth Twice daily    guaifenesin (MUCINEX) 1,200 mg Oral Tablet Extended Release 12hr Take 1 Tablet (1,200 mg total) by mouth Every morning    Ibuprofen (MOTRIN) 200 mg Oral Tablet Take 1 Tablet (200 mg total) by mouth Four times a day as needed for Pain    ipratropium-albuterol  0.5 mg-3 mg(2.5 mg base)/3 mL Solution for Nebulization Take 3 mL by nebulization Four times a day Indications: bronchi muscle spasm resulting from COPD    losartan (COZAAR) 50 mg Oral Tablet Take 2 Tablets (100 mg total) by mouth Daily     Allergy List  Allergy History as of 01/03/24        No Known Allergies                  Family History   Family Medical History:       Problem Relation (Age of Onset)    Breast Cancer Maternal Aunt    No Known Problems Mother, Father, Sister, Brother, Maternal Grandmother, Maternal Grandfather, Paternal Grandmother, Paternal Grandfather, Daughter, Son, Maternal Uncle, Paternal Aunt, Paternal Uncle, Other            Social History  Social History     Socioeconomic History    Marital status: Married   Tobacco Use    Smoking status: Former     Current packs/day: 0.00     Average packs/day: 1 pack/day for 42.0 years (42.0 ttl pk-yrs)     Types: Cigarettes     Start date: 1968     Quit date: 2010     Years since quitting: 15.8    Smokeless tobacco: Never    Tobacco comments:     Counseled by Ronal Kerns FNP on 12/28/2022   Vaping Use    Vaping status: Never Used   Substance and Sexual Activity    Alcohol use: Never    Drug use: Never        Review of system  General:  Denies fever, chills, night sweats, loss of appetite.  Neurological:  Denies dizziness or seizures.  Ears, Nose, Throat: Denies ear pain, nasal/sinus congestion or pain, or sore throat.   Gastrointestinal:  Denies uncontrolled reflux, heartburn, nausea, or  diarrhea.  Cardiovascular:  Denies chest pain or irregular heartbeats.  Respiratory: see HPI  Musculoskeletal:  Denies uncontrolled joint pain or restless legs.  Endocrine/Metabolic:  Denies weight gain or weight loss.  Mental Status/Psychiatric:  Denies uncontrolled anxiety or depression.  Integumentary:  Denies rash or new abnormal skin lesions.  Objective:  Vital Signs  Vitals:    12/31/23 1520   BP: (!) 164/70   Pulse: 82   Resp: 20   Temp: 37.2 C (99 F)   TempSrc: Oral   SpO2: 94%   Weight: 70.3 kg (155 lb)   Height: 1.753 m (5' 9)   BMI: 22.89          Physical Exam  GENERAL APPEARANCE OF THE PT: Alert, no acute distress. Normal appearance, well nourished.  EYES: Normal eye lids. Conjunctiva normal.  EARS NOSE MOUTH AND THROAT: External inspection of ears and nose with normal appearance. Nares patent.  NECK: Supple with trachea midline, non tender, no nodules, no masses, gland position midline.  RESPIRATORY: Lungs with distant but clear breath sounds, minimal scarring noted, no rales, no rhonchi, no wheezing. Respiratory effort with no tractions, breathing regular and unlabored.  CARDIOVASCULAR: Heart sounds normal with regular rhythm and regular rate. No murmur, no peripheral edema.  MUSCULOSKELETAL: Normal gait and station, normal digits, no digital cyanosis or clubbing.  MENTAL STATUSPSYCHIATRIC: Alert- oriented to person, place, and time. Appropriate and normal mood.         ICD-10-CM    1. Pulmonary nodule  R91.1 CT Chest WO      2. Chronic obstructive pulmonary disease, unspecified COPD type (CMS HCC)  J44.9       3. 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 No recent use of albuterol . CT scan shows stable pulmonary nodules, largest 5 mm, with no new nodules. Nodules are round and calcified, not concerning for malignancy. Left upper lobe  scarring likely from past pneumonia. No respiratory symptoms warranting further investigation. Smoking cessation in 2010, no current vaping. Occasional shortness of breath with exertion, managed by rest.  - Sent refill for Breztri  inhaler for 90 days supply.  - Scheduled yearly CT scan for September 2026.  - Scheduled follow-up appointment in 6 months.    Chronic neck pain  Managed with Botox  injections. Recent round of Botox  was beneficial. Pain managed with 16 mg Cymbalta  and Tylenol during moving activities.  - Continue Botox  injections as needed.  - Continue current pain management with Cymbalta  and Tylenol.    Orders Placed This Encounter    CT Chest WO    budesonide-glycopyr-formoterol (BREZTRI  AEROSPHERE) 160-9-4.8 mcg/actuation Inhalation HFA Aerosol Inhaler        Continue medications as prescribed/directed unless changed by provider.  Plan of care discussed with patient.    Return in about 6 months (around 06/30/2024).. Patient was advised to come back earlier if any symptoms get worse.  Also advised to come back for results of any test done.  If unable to keep regular appointment, patient was advised to schedule another appointment as soon as possible.     The patient was given the opportunity to ask questions and those questions were answered to the patient's satisfaction. The patient was encouraged to call with any additional questions or concerns. Discussed with the patient effects and side effects of medications. Medication safety was discussed.  The patient was informed to contact the office within 7 business days if a message/lab results/referral/imaging results have not been conveyed to the patient.    Electronically signed by Ronal Kerns, APRN, CNP     This note was created with assistance from [employer] via  capture of conversational audio. Consent was obtained from the patient and all parties present prior to recording.        This note may have been partially generated using M[employer] Fluency Direct  system, and there may be some incorrect words, spellings, and punctuation that were not noted in checking the note before saving.

## 2024-01-01 ENCOUNTER — Encounter (HOSPITAL_COMMUNITY): Payer: Self-pay

## 2024-01-02 ENCOUNTER — Ambulatory Visit (INDEPENDENT_AMBULATORY_CARE_PROVIDER_SITE_OTHER): Payer: Self-pay | Admitting: NEUROLOGY

## 2024-01-02 ENCOUNTER — Other Ambulatory Visit: Payer: Self-pay

## 2024-01-02 ENCOUNTER — Encounter (INDEPENDENT_AMBULATORY_CARE_PROVIDER_SITE_OTHER): Payer: Self-pay | Admitting: NEUROLOGY

## 2024-01-02 VITALS — BP 176/66 | HR 75 | Temp 98.4°F

## 2024-01-02 DIAGNOSIS — G43711 Chronic migraine without aura, intractable, with status migrainosus: Secondary | ICD-10-CM

## 2024-01-02 MED ORDER — ONABOTULINUMTOXINA 100 UNIT SOLUTION FOR INJECTION
100.0000 [IU] | Freq: Once | INTRAMUSCULAR | Status: AC
Start: 2024-01-02 — End: 2024-01-02
  Administered 2024-01-02: 100 [IU] via INTRAMUSCULAR

## 2024-01-02 MED ORDER — ONABOTULINUMTOXINA 100 UNIT SOLUTION FOR INJECTION
55.0000 [IU] | Freq: Once | INTRAMUSCULAR | Status: AC
Start: 2024-01-02 — End: 2024-01-02
  Administered 2024-01-02: 55 [IU] via INTRAMUSCULAR

## 2024-01-02 NOTE — Procedures (Signed)
 NEUROLOGY, [facility]  [address]  [place] [address]    Procedure Note    Name: Mckenzie Stewart MRN:  [id number]   Date: 01/02/2024 DOB:  January 01, 1956 (67 y.o.)         [phone] - MUSCLE(S) INNERVATED BY FACIAL, TRIGEMINAL, CERVICAL SPINAL AND ACCESSORY NERVES, BILATERAL (CHRONIC MIGRAINE) (AMB ONLY)    Date/Time: 01/02/2024 2:37 PM    Performed by: Sherre Senior, DO  Authorized by: Sherre Senior, DO      Botox  Visit Note    Neurotoxin Treatment Clinic: BTX (chemodenervation with botulinum toxin, Botox ) for CM (chronic migraine), PREEMPT protocol     Last Visit: October 10, 2023    Mckenzie Stewart presents for Botox .  This will be her 2nd set of injections.  After initial set, she did have some pain in her left lower jaw unclear if this was related or not.  She otherwise tolerated well and did feel like she got a significant benefit in headache reduction.  She does feel that occipital nerve blocks also continuing be quite helpful.    Injections: 2:1 dilution BoNT-A (onabotulinum toxin A)    Location Left Right Center Number of Injections   Procerus   5 1   Corrugators 5 5  1  each   Frontalis 10 10  2  each   Temporalis 20 20  4  each   Occipitalis 15 15  3  each   Cervical Paraspinals 10 10  2  each   Trapezius 15 15  3  each   Total   155      Type of Toxin: A  Total Units: 155  Discarded Units: 45    Procedure well tolerated.    RTC 12 weeks    S. Darlyn Sherre, DO  Assistant Professor of Neurology  [place]  [facility]

## 2024-02-01 ENCOUNTER — Encounter (INDEPENDENT_AMBULATORY_CARE_PROVIDER_SITE_OTHER): Payer: Self-pay | Admitting: NEUROLOGY

## 2024-02-14 ENCOUNTER — Ambulatory Visit (INDEPENDENT_AMBULATORY_CARE_PROVIDER_SITE_OTHER): Payer: Self-pay | Admitting: NEUROLOGY

## 2024-04-03 ENCOUNTER — Ambulatory Visit (INDEPENDENT_AMBULATORY_CARE_PROVIDER_SITE_OTHER): Payer: Self-pay | Admitting: NEUROLOGY

## 2024-04-03 ENCOUNTER — Other Ambulatory Visit: Payer: Self-pay

## 2024-04-03 VITALS — BP 155/63 | HR 82 | Temp 96.8°F | Wt 150.4 lb

## 2024-04-03 DIAGNOSIS — M5481 Occipital neuralgia: Secondary | ICD-10-CM

## 2024-04-03 MED ORDER — BUPIVACAINE HCL 0.5 % (5 MG/ML) INJECTION SOLUTION
10.0000 mL | INTRAMUSCULAR | Status: AC
  Administered 2024-04-03: 10 mL via INTRAMUSCULAR

## 2024-04-03 MED ORDER — DEXAMETHASONE SODIUM PHOSPHATE 4 MG/ML INJECTION SOLUTION
4.0000 mg | INTRAMUSCULAR | Status: AC
  Administered 2024-04-03: 4 mg via INTRAMUSCULAR

## 2024-04-03 NOTE — Procedures (Signed)
 NEUROLOGY, [facility]  [address]  [place] [address]    Procedure Note    Name: Mckenzie Stewart MRN:  [id number]   Date: 04/03/2024 DOB:  1955-11-18 (68 y.o.)         64405 - INJ ANESTHETIC/STEROID; GREATER OCCIPITAL NERVE (AMB ONLY-PD)    Date/Time: 04/03/2024 2:13 PM    Performed by: Sherre Senior, DO  Authorized by: Sherre Senior, DO    Nerve Block-ProcDoc    Date/Time: 04/03/2024 2:13 PM    Performed by: Sherre Senior, DO  Authorized by: Sherre Senior, DO      The greater occipital nerve was located by palaption bilaterally and both sites injected with a combination of 1 cc of bupivacaine  (0.5% - 5mg /ml) and 1/2 cc of decadron  (4mg /ml) for each injection using a 27 gauge needle and 3cc syringe.  The patient tolerated the procedure without difficulty and with minimal blood loss. She was advised to apply ice or heat to the area of injection if soreness is noted.    [name], DO  Assistant Professor of Neurology  [place]  [facility]

## 2024-04-15 ENCOUNTER — Other Ambulatory Visit (INDEPENDENT_AMBULATORY_CARE_PROVIDER_SITE_OTHER): Payer: Self-pay | Admitting: NEUROLOGY

## 2024-05-01 ENCOUNTER — Ambulatory Visit (INDEPENDENT_AMBULATORY_CARE_PROVIDER_SITE_OTHER): Payer: Self-pay | Admitting: NEUROLOGY

## 2024-06-30 ENCOUNTER — Ambulatory Visit (HOSPITAL_COMMUNITY): Payer: Self-pay | Admitting: SLEEP MEDICINE

## 2024-12-30 ENCOUNTER — Ambulatory Visit (HOSPITAL_COMMUNITY): Payer: Self-pay | Admitting: SLEEP MEDICINE
